# Patient Record
Sex: Male | Born: 1970 | Race: White | Hispanic: No | Marital: Married | Smoking: Never smoker
Health system: Southern US, Community
[De-identification: ages and names within clinical notes are randomized; demographics above are authoritative.]

## PROBLEM LIST (undated history)

## (undated) DIAGNOSIS — Z79899 Other long term (current) drug therapy: Secondary | ICD-10-CM

## (undated) DIAGNOSIS — F32A Depression, unspecified: Secondary | ICD-10-CM

## (undated) DIAGNOSIS — F329 Major depressive disorder, single episode, unspecified: Secondary | ICD-10-CM

## (undated) DIAGNOSIS — T7840XA Allergy, unspecified, initial encounter: Secondary | ICD-10-CM

## (undated) HISTORY — DX: Allergy, unspecified, initial encounter: T78.40XA

## (undated) HISTORY — PX: SHOULDER SURGERY: SHX246

## (undated) HISTORY — DX: Other long term (current) drug therapy: Z79.899

## (undated) HISTORY — DX: Major depressive disorder, single episode, unspecified: F32.9

## (undated) HISTORY — DX: Depression, unspecified: F32.A

---

## 2007-06-18 ENCOUNTER — Ambulatory Visit (HOSPITAL_COMMUNITY): Admission: RE | Admit: 2007-06-18 | Discharge: 2007-06-18 | Payer: Self-pay | Admitting: Family Medicine

## 2011-04-14 ENCOUNTER — Ambulatory Visit (INDEPENDENT_AMBULATORY_CARE_PROVIDER_SITE_OTHER): Payer: PRIVATE HEALTH INSURANCE

## 2011-04-14 DIAGNOSIS — M545 Low back pain: Secondary | ICD-10-CM

## 2011-04-14 DIAGNOSIS — M546 Pain in thoracic spine: Secondary | ICD-10-CM

## 2011-09-02 ENCOUNTER — Ambulatory Visit (INDEPENDENT_AMBULATORY_CARE_PROVIDER_SITE_OTHER): Payer: PRIVATE HEALTH INSURANCE | Admitting: Physician Assistant

## 2011-09-02 VITALS — BP 110/70 | HR 72 | Temp 98.1°F | Resp 17 | Ht 69.0 in | Wt 143.0 lb

## 2011-09-02 DIAGNOSIS — F411 Generalized anxiety disorder: Secondary | ICD-10-CM

## 2011-09-02 DIAGNOSIS — F419 Anxiety disorder, unspecified: Secondary | ICD-10-CM

## 2011-09-02 DIAGNOSIS — G4700 Insomnia, unspecified: Secondary | ICD-10-CM

## 2011-09-02 DIAGNOSIS — F341 Dysthymic disorder: Secondary | ICD-10-CM

## 2011-09-02 LAB — POCT CBC
Hemoglobin: 16.8 g/dL (ref 14.1–18.1)
MPV: 8.3 fL (ref 0–99.8)
POC Granulocyte: 2.6 (ref 2–6.9)
POC MID %: 7.1 %M (ref 0–12)
RBC: 5.62 M/uL (ref 4.69–6.13)

## 2011-09-02 MED ORDER — BUPROPION HCL ER (SR) 100 MG PO TB12: 100.0000 mg | ORAL_TABLET | Freq: Two times a day (BID) | ORAL | Status: AC

## 2011-09-02 MED ORDER — BUPROPION HCL 75 MG PO TABS: 75.0000 mg | ORAL_TABLET | Freq: Two times a day (BID) | ORAL | Status: DC

## 2011-09-02 MED ORDER — CLONAZEPAM 0.5 MG PO TABS: ORAL_TABLET | ORAL | Status: DC

## 2011-09-02 NOTE — Progress Notes (Signed)
  Subjective:    Patient ID: Erik Cain, male    DOB: [birth year], 41 y.o.   MRN: [id number]  HPI 41 yr old CM here to discuss medication for anxiety/depression.  His partner attempted suicide last week.  His partner has a h/o drug and alcohol abuse.  His partner has now been discharged from the hospital and is now on a 90 day meeting contract to go to AA.  Patient started counseling this morning with Ernest Pine.  She suggested he may have been suffering from depression for a while prior to his partner's suicide attempt.  He admits to anhedonia.  He admits to working too much, sleeping too much and either eating too much or too little for 7 or 8 months. He is quite preoccupied with his partner's issues and behaviors. But, he does practice self-care-works out with a trainer 3X/week and exercise class 2X/week.  Denies SI/HI Previously the only med he has ever taken that was anxiety/depression related was imipramine  Review of Systems  All other systems reviewed and are negative.       Objective:   Physical Exam  Constitutional: He is oriented to person, place, and time. He appears well-developed and well-nourished.  HENT:  Head: Normocephalic and atraumatic.  Neck: Normal range of motion. Neck supple. No thyromegaly present.  Cardiovascular: Normal rate, regular rhythm and normal heart sounds.   Pulmonary/Chest: Effort normal and breath sounds normal.  Neurological: He is alert and oriented to person, place, and time.  Skin: Skin is warm and dry.  Psychiatric: He has a normal mood and affect. His behavior is normal. Judgment and thought content normal.    Results for orders placed in visit on 09/02/11  TSH      Component Value Range   TSH 1.662  0.350 - 4.500 uIU/mL  VITAMIN D 25 HYDROXY      Component Value Range   Vit D, 25-Hydroxy 47  30 - 89 ng/mL  POCT CBC      Component Value Range   WBC 4.5 (*) 4.6 - 10.2 K/uL   Lymph, poc 1.6  0.6 - 3.4   POC LYMPH PERCENT 35.5  10 -  50 %L   MID (cbc) 0.3  0 - 0.9   POC MID % 7.1  0 - 12 %M   POC Granulocyte 2.6  2 - 6.9   Granulocyte percent 57.4  37 - 80 %G   RBC 5.62  4.69 - 6.13 M/uL   Hemoglobin 16.8  14.1 - 18.1 g/dL   HCT, POC 40.9  81.1 - 53.7 %   MCV 90.4  80 - 97 fL   MCH, POC 29.9  27 - 31.2 pg   MCHC 33.1  31.8 - 35.4 g/dL   RDW, POC 91.4     Platelet Count, POC 336  142 - 424 K/uL   MPV 8.3  0 - 99.8 fL       Assessment & Plan:  Reactive depression/anxiety-start wellbutrin.  Clonazepam  For breakthrough anxiety. Continue counseling.  [place] meeting schedule given and encouraged attendance. Continue exercise and other healthy coping behaviors.  Spent >30 mins face to face.

## 2011-09-03 ENCOUNTER — Encounter: Payer: Self-pay | Admitting: Family Medicine

## 2013-11-06 ENCOUNTER — Ambulatory Visit (INDEPENDENT_AMBULATORY_CARE_PROVIDER_SITE_OTHER): Payer: No Typology Code available for payment source | Admitting: Internal Medicine

## 2013-11-06 ENCOUNTER — Ambulatory Visit (INDEPENDENT_AMBULATORY_CARE_PROVIDER_SITE_OTHER): Payer: No Typology Code available for payment source

## 2013-11-06 VITALS — BP 114/72 | HR 83 | Temp 98.4°F | Resp 16 | Ht 69.0 in | Wt 153.0 lb

## 2013-11-06 DIAGNOSIS — S6990XA Unspecified injury of unspecified wrist, hand and finger(s), initial encounter: Secondary | ICD-10-CM

## 2013-11-06 DIAGNOSIS — S6992XA Unspecified injury of left wrist, hand and finger(s), initial encounter: Secondary | ICD-10-CM

## 2013-11-06 DIAGNOSIS — S6000XA Contusion of unspecified finger without damage to nail, initial encounter: Secondary | ICD-10-CM

## 2013-11-06 DIAGNOSIS — S6980XA Other specified injuries of unspecified wrist, hand and finger(s), initial encounter: Secondary | ICD-10-CM

## 2013-11-06 DIAGNOSIS — S60022A Contusion of left index finger without damage to nail, initial encounter: Secondary | ICD-10-CM

## 2013-11-06 NOTE — Patient Instructions (Signed)
Contusion °A contusion is a deep bruise. Contusions are the result of an injury that caused bleeding under the skin. The contusion may turn blue, purple, or yellow. Minor injuries will give you a painless contusion, but more severe contusions may stay painful and swollen for a few weeks.  °CAUSES  °A contusion is usually caused by a blow, trauma, or direct force to an area of the body. °SYMPTOMS  °· Swelling and redness of the injured area. °· Bruising of the injured area. °· Tenderness and soreness of the injured area. °· Pain. °DIAGNOSIS  °The diagnosis can be made by taking a history and physical exam. An X-ray, CT scan, or MRI may be needed to determine if there were any associated injuries, such as fractures. °TREATMENT  °Specific treatment will depend on what area of the body was injured. In general, the best treatment for a contusion is resting, icing, elevating, and applying cold compresses to the injured area. Over-the-counter medicines may also be recommended for pain control. Ask your caregiver what the best treatment is for your contusion. °HOME CARE INSTRUCTIONS  °· Put ice on the injured area. °¨ Put ice in a plastic bag. °¨ Place a towel between your skin and the bag. °¨ Leave the ice on for 15-20 minutes, 3-4 times a day, or as directed by your health care provider. °· Only take over-the-counter or prescription medicines for pain, discomfort, or fever as directed by your caregiver. Your caregiver may recommend avoiding anti-inflammatory medicines (aspirin, ibuprofen, and naproxen) for 48 hours because these medicines may increase bruising. °· Rest the injured area. °· If possible, elevate the injured area to reduce swelling. °SEEK IMMEDIATE MEDICAL CARE IF:  °· You have increased bruising or swelling. °· You have pain that is getting worse. °· Your swelling or pain is not relieved with medicines. °MAKE SURE YOU:  °· Understand these instructions. °· Will watch your condition. °· Will get help right  away if you are not doing well or get worse. °Document Released: 12/17/2004 Document Revised: 03/14/2013 Document Reviewed: 01/12/2011 °ExitCare® Patient Information ©2015 [employer]. This information is not intended to replace advice given to you by your health care provider. Make sure you discuss any questions you have with your health care provider. ° °

## 2013-11-06 NOTE — Progress Notes (Signed)
   Subjective:    Patient ID: Erik Cain, male    DOB: February 14, 1971, 43 y.o.   MRN: [id number]  HPI Patient presents today with left hand pain. He was moving a table about 2 weekends ago when his left hand was smashed between a table and a column. It turned black and blue and had some swelling. He used an OTC splint and the swelling and discoloration resolved, but he continues to have numbness of his left index finger. He felt like it was doing generally better until he tried to pick up his pen with that hand today and had shooting pain from base of finger to tip and residual pain. Has taken some ibuprofen/acetaminophen after accident with some relief.   Review of Systems No weakness, feels tight over knuckle, some decreased ROM.    Objective:   Physical Exam  Vitals reviewed. Constitutional: He is oriented to person, place, and time. He appears well-developed and well-nourished.  HENT:  Head: Normocephalic and atraumatic.  Neck: Normal range of motion. Neck supple.  Cardiovascular: Normal rate.   Pulmonary/Chest: Effort normal.  Musculoskeletal:  Left index finger with slight swelling of area around PIP. No erythema, no discoloration. No weakness, slight decreased ROM with flexion.  Neurological: He is alert and oriented to person, place, and time.  Skin: Skin is warm and dry.  Psychiatric: He has a normal mood and affect. His behavior is normal. Judgment and thought content normal.   Left index finger xray- [facility] reading (PRIMARY) by  Dr. Merla Richesoolittle- no fracture       Assessment & Plan:  1. Injury of index finger, left, initial encounter - DG Finger Index Left; Future  2. Contusion of left index finger without damage to nail, initial encounter - Can use NSAIDs or acetaminophen as needed for pain -heat prn -gentle ROM several times a day   Emi Belfasteborah B. Gilberte Gorley, FNP-BC  [facility], [facility]  11/06/2013 6:22 PM I have completed the  patient encounter in its entirety as documented by FNP Leone PayorGessner, with editing by me where necessary. Robert P. Merla Richesoolittle, M.D.

## 2014-03-05 ENCOUNTER — Ambulatory Visit: Payer: No Typology Code available for payment source

## 2014-10-15 ENCOUNTER — Encounter: Payer: Self-pay | Admitting: Family Medicine

## 2014-10-15 ENCOUNTER — Ambulatory Visit (INDEPENDENT_AMBULATORY_CARE_PROVIDER_SITE_OTHER): Payer: No Typology Code available for payment source | Admitting: Family Medicine

## 2014-10-15 VITALS — BP 110/68 | HR 70 | Ht 68.5 in | Wt 156.0 lb

## 2014-10-15 DIAGNOSIS — Z114 Encounter for screening for human immunodeficiency virus [HIV]: Secondary | ICD-10-CM | POA: Diagnosis not present

## 2014-10-15 NOTE — Progress Notes (Signed)
   Subjective:    Patient ID: Erik Cain, male    DOB: 04/12/1970, 44 y.o.   MRN: [id number]  HPI He is here for a get acquainted visit. He has not had a physician in quite some time. He has no particular concerns or complaints. He is involved in a research protocol. The protocol is essentially giving an antibody and they will be measuring this. He does need to be given Truvada on a regular basis. He will get this free. They will be checking him every month for HIV and RPR. He has had a recent test which was negative. Review of the record also indicates he needs an immunization update. He is involved in a relationship last 4 months. Theparently monogamous.  Review of Systems     Objective:   Physical Exam Alert and in no distress otherwise not examined       Assessment & Plan:  Encounter for screening for HIV - Plan: emtricitabine-tenofovir (TRUVADA) 200-300 MG per tablet  he will be tested HIV and RPR on a regular basis. If he is indeed positive the research protocol would then send me the information. They will be doing routine blood screening on him. Discussed health maintenance with him. Since he will be getting close follow-up for next 2 years, I do not think that a complete exam a blood work at this time is necessary. He is comfortable with this.

## 2014-10-16 ENCOUNTER — Telehealth: Payer: Self-pay | Admitting: Family Medicine

## 2014-10-16 MED ORDER — EMTRICITABINE-TENOFOVIR DF 200-300 MG PO TABS: 1.0000 | ORAL_TABLET | Freq: Every day | ORAL | Status: DC

## 2014-10-16 NOTE — Telephone Encounter (Signed)
Pt called stating that he does not need the Truvada that was sent to [facility] because he was given a hand written script for the same med so he can send it in to his mail order pharmacy where he can get the med at no cost or at least less expensive. I called [facility] and cancelled med today.

## 2014-12-26 ENCOUNTER — Encounter: Payer: Self-pay | Admitting: Family Medicine

## 2014-12-28 ENCOUNTER — Telehealth: Payer: Self-pay | Admitting: Family Medicine

## 2014-12-28 NOTE — Telephone Encounter (Signed)
Work with him on this. I don't care where he gets it

## 2014-12-28 NOTE — Telephone Encounter (Signed)
Please be on the lookout for Rx request then.

## 2014-12-28 NOTE — Telephone Encounter (Signed)
Pt called and was wanting you to cancel there Rx for truvada said he went to get it last night and it was 1000$ and now he is going to get it through drugstore mail order pharmacy for free, pt is going through a research study program through [facility] for HIV, testing, the mail order service is covering the cost of the RX, pt can pt uses the [facility] at [address] Pt said if you had any questions please contact them at [phone], the number for the mail order service is [phone], he said they where suppose to be getting in contact with the office about RX

## 2014-12-31 ENCOUNTER — Other Ambulatory Visit: Payer: Self-pay

## 2014-12-31 DIAGNOSIS — Z114 Encounter for screening for human immunodeficiency virus [HIV]: Secondary | ICD-10-CM

## 2014-12-31 MED ORDER — EMTRICITABINE-TENOFOVIR DF 200-300 MG PO TABS: 1.0000 | ORAL_TABLET | Freq: Every day | ORAL | Status: DC

## 2014-12-31 NOTE — Telephone Encounter (Signed)
Med sent in.

## 2014-12-31 NOTE — Telephone Encounter (Signed)
I have called [facility] and canceled Rx will call other pharmacy next

## 2015-06-23 ENCOUNTER — Ambulatory Visit (INDEPENDENT_AMBULATORY_CARE_PROVIDER_SITE_OTHER): Payer: Self-pay | Admitting: Osteopathic Medicine

## 2015-06-23 ENCOUNTER — Ambulatory Visit (INDEPENDENT_AMBULATORY_CARE_PROVIDER_SITE_OTHER): Payer: Self-pay

## 2015-06-23 VITALS — BP 122/72 | HR 90 | Temp 98.6°F | Resp 17 | Ht 69.0 in | Wt 155.0 lb

## 2015-06-23 DIAGNOSIS — M25571 Pain in right ankle and joints of right foot: Secondary | ICD-10-CM

## 2015-06-23 DIAGNOSIS — S93401A Sprain of unspecified ligament of right ankle, initial encounter: Secondary | ICD-10-CM | POA: Insufficient documentation

## 2015-06-23 DIAGNOSIS — Z79899 Other long term (current) drug therapy: Secondary | ICD-10-CM

## 2015-06-23 HISTORY — DX: Other long term (current) drug therapy: Z79.899

## 2015-06-23 MED ORDER — IBUPROFEN 600 MG PO TABS: 600.0000 mg | ORAL_TABLET | Freq: Three times a day (TID) | ORAL | Status: AC | PRN

## 2015-06-23 NOTE — Patient Instructions (Addendum)
X-ray did not show a broken ankle, likely this is a severe sprain. Follow instructions as noted below for rehabilitation. Follow up with your primary care physician in one to 2 weeks, sooner if this gets worse. Ibuprofen or Tylenol as needed for pain, caution with Ibuprofen as high doses of this can increase risk of kidney problems with the Truvada.   Acute Ankle Sprain With Phase I Rehab An acute ankle sprain is a partial or complete tear in one or more of the ligaments of the ankle due to traumatic injury. The severity of the injury depends on both the number of ligaments sprained and the grade of sprain. There are 3 grades of sprains.   A grade 1 sprain is a mild sprain. There is a slight pull without obvious tearing. There is no loss of strength, and the muscle and ligament are the correct length.  A grade 2 sprain is a moderate sprain. There is tearing of fibers within the substance of the ligament where it connects two bones or two cartilages. The length of the ligament is increased, and there is usually decreased strength.  A grade 3 sprain is a complete rupture of the ligament and is uncommon. In addition to the grade of sprain, there are three types of ankle sprains.  Lateral ankle sprains: This is a sprain of one or more of the three ligaments on the outer side (lateral) of the ankle. These are the most common sprains. Medial ankle sprains: There is one large triangular ligament of the inner side (medial) of the ankle that is susceptible to injury. Medial ankle sprains are less common. Syndesmosis, "high ankle," sprains: The syndesmosis is the ligament that connects the two bones of the lower leg. Syndesmosis sprains usually only occur with very severe ankle sprains. SYMPTOMS  Pain, tenderness, and swelling in the ankle, starting at the side of injury that may progress to the whole ankle and foot with time.  "Pop" or tearing sensation at the time of injury.  Bruising that may spread to  the heel.  Impaired ability to walk soon after injury. CAUSES   Acute ankle sprains are caused by trauma placed on the ankle that temporarily forces or pries the anklebone (talus) out of its normal socket.  Stretching or tearing of the ligaments that normally hold the joint in place (usually due to a twisting injury). RISK INCREASES WITH:  Previous ankle sprain.  Sports in which the foot may land awkwardly (i.e., basketball, volleyball, or soccer) or walking or running on uneven or rough surfaces.  Shoes with inadequate support to prevent sideways motion when stress occurs.  Poor strength and flexibility.  Poor balance skills.  Contact sports. PREVENTION   Warm up and stretch properly before activity.  Maintain physical fitness:  Ankle and leg flexibility, muscle strength, and endurance.  Cardiovascular fitness.  Balance training activities.  Use proper technique and have a coach correct improper technique.  Taping, protective strapping, bracing, or high-top tennis shoes may help prevent injury. Initially, tape is best; however, it loses most of its support function within 10 to 15 minutes.  Wear proper-fitted protective shoes (High-top shoes with taping or bracing is more effective than either alone).  Provide the ankle with support during sports and practice activities for 12 months following injury. PROGNOSIS   If treated properly, ankle sprains can be expected to recover completely; however, the length of recovery depends on the degree of injury.  A grade 1 sprain usually heals enough in 5  to 7 days to allow modified activity and requires an average of 6 weeks to heal completely.  A grade 2 sprain requires 6 to 10 weeks to heal completely.  A grade 3 sprain requires 12 to 16 weeks to heal.  A syndesmosis sprain often takes more than 3 months to heal. RELATED COMPLICATIONS   Frequent recurrence of symptoms may result in a chronic problem. Appropriately  addressing the problem the first time decreases the frequency of recurrence and optimizes healing time. Severity of the initial sprain does not predict the likelihood of later instability.  Injury to other structures (bone, cartilage, or tendon).  A chronically unstable or arthritic ankle joint is a possibility with repeated sprains. TREATMENT Treatment initially involves the use of ice, medication, and compression bandages to help reduce pain and inflammation. Ankle sprains are usually immobilized in a walking cast or boot to allow for healing. Crutches may be recommended to reduce pressure on the injury. After immobilization, strengthening and stretching exercises may be necessary to regain strength and a full range of motion. Surgery is rarely needed to treat ankle sprains. MEDICATION   Nonsteroidal anti-inflammatory medications, such as aspirin and ibuprofen (do not take for the first 3 days after injury or within 7 days before surgery), or other minor pain relievers, such as acetaminophen, are often recommended. Take these as directed by your caregiver. Contact your caregiver immediately if any bleeding, stomach upset, or signs of an allergic reaction occur from these medications.  Ointments applied to the skin may be helpful.  Pain relievers may be prescribed as necessary by your caregiver. Do not take prescription pain medication for longer than 4 to 7 days. Use only as directed and only as much as you need. HEAT AND COLD  Cold treatment (icing) is used to relieve pain and reduce inflammation for acute and chronic cases. Cold should be applied for 10 to 15 minutes every 2 to 3 hours for inflammation and pain and immediately after any activity that aggravates your symptoms. Use ice packs or an ice massage.  Heat treatment may be used before performing stretching and strengthening activities prescribed by your caregiver. Use a heat pack or a warm soak. SEEK IMMEDIATE MEDICAL CARE IF:    Pain, swelling, or bruising worsens despite treatment.  You experience pain, numbness, discoloration, or coldness in the foot or toes.  New, unexplained symptoms develop (drugs used in treatment may produce side effects.) EXERCISES  PHASE I EXERCISES RANGE OF MOTION (ROM) AND STRETCHING EXERCISES - Ankle Sprain, Acute Phase I, Weeks 1 to 2 These exercises may help you when beginning to restore flexibility in your ankle. You will likely work on these exercises for the 1 to 2 weeks after your injury. Once your physician, physical therapist, or athletic trainer sees adequate progress, he or she will advance your exercises. While completing these exercises, remember:   Restoring tissue flexibility helps normal motion to return to the joints. This allows healthier, less painful movement and activity.  An effective stretch should be held for at least 30 seconds.  A stretch should never be painful. You should only feel a gentle lengthening or release in the stretched tissue. RANGE OF MOTION - Dorsi/Plantar Flexion  While sitting with your right / left knee straight, draw the top of your foot upwards by flexing your ankle. Then reverse the motion, pointing your toes downward.  Hold each position for __________ seconds.  After completing your first set of exercises, repeat this exercise with your knee  bent. Repeat __________ times. Complete this exercise __________ times per day.  RANGE OF MOTION - Ankle Alphabet  Imagine your right / left big toe is a pen.  Keeping your hip and knee still, write out the entire alphabet with your "pen." Make the letters as large as you can without increasing any discomfort. Repeat __________ times. Complete this exercise __________ times per day.  STRENGTHENING EXERCISES - Ankle Sprain, Acute -Phase I, Weeks 1 to 2 These exercises may help you when beginning to restore strength in your ankle. You will likely work on these exercises for 1 to 2 weeks after  your injury. Once your physician, physical therapist, or athletic trainer sees adequate progress, he or she will advance your exercises. While completing these exercises, remember:   Muscles can gain both the endurance and the strength needed for everyday activities through controlled exercises.  Complete these exercises as instructed by your physician, physical therapist, or athletic trainer. Progress the resistance and repetitions only as guided.  You may experience muscle soreness or fatigue, but the pain or discomfort you are trying to eliminate should never worsen during these exercises. If this pain does worsen, stop and make certain you are following the directions exactly. If the pain is still present after adjustments, discontinue the exercise until you can discuss the trouble with your clinician. STRENGTH - Dorsiflexors  Secure a rubber exercise band/tubing to a fixed object (i.e., table, pole) and loop the other end around your right / left foot.  Sit on the floor facing the fixed object. The band/tubing should be slightly tense when your foot is relaxed.  Slowly draw your foot back toward you using your ankle and toes.  Hold this position for __________ seconds. Slowly release the tension in the band and return your foot to the starting position. Repeat __________ times. Complete this exercise __________ times per day.  STRENGTH - Plantar-flexors   Sit with your right / left leg extended. Holding onto both ends of a rubber exercise band/tubing, loop it around the ball of your foot. Keep a slight tension in the band.  Slowly push your toes away from you, pointing them downward.  Hold this position for __________ seconds. Return slowly, controlling the tension in the band/tubing. Repeat __________ times. Complete this exercise __________ times per day.  STRENGTH - Ankle Eversion  Secure one end of a rubber exercise band/tubing to a fixed object (table, pole). Loop the other end  around your foot just before your toes.  Place your fists between your knees. This will focus your strengthening at your ankle.  Drawing the band/tubing across your opposite foot, slowly, pull your little toe out and up. Make sure the band/tubing is positioned to resist the entire motion.  Hold this position for __________ seconds. Have your muscles resist the band/tubing as it slowly pulls your foot back to the starting position.  Repeat __________ times. Complete this exercise __________ times per day.  STRENGTH - Ankle Inversion  Secure one end of a rubber exercise band/tubing to a fixed object (table, pole). Loop the other end around your foot just before your toes.  Place your fists between your knees. This will focus your strengthening at your ankle.  Slowly, pull your big toe up and in, making sure the band/tubing is positioned to resist the entire motion.  Hold this position for __________ seconds.  Have your muscles resist the band/tubing as it slowly pulls your foot back to the starting position. Repeat __________ times. Complete  this exercises __________ times per day.  STRENGTH - Towel Curls  Sit in a chair positioned on a non-carpeted surface.  Place your right / left foot on a towel, keeping your heel on the floor.  Pull the towel toward your heel by only curling your toes. Keep your heel on the floor.  If instructed by your physician, physical therapist, or athletic trainer, add weight to the end of the towel. Repeat __________ times. Complete this exercise __________ times per day.   This information is not intended to replace advice given to you by your health care provider. Make sure you discuss any questions you have with your health care provider.   Document Released: 10/08/2004 Document Revised: 03/30/2014 Document Reviewed: 06/21/2008 Elsevier Interactive Patient Education 2016 [employer].    IF you received an x-ray today, you will receive an invoice  from [facility]. Please contact [facility] at [phone] with questions or concerns regarding your invoice.   IF you received labwork today, you will receive an invoice from [employer]. Please contact [facility] at [phone] with questions or concerns regarding your invoice.   Our billing staff will not be able to assist you with questions regarding bills from these companies.  You will be contacted with the lab results as soon as they are available. The fastest way to get your results is to activate your My Chart account. Instructions are located on the last page of this paperwork. If you have not heard from [place] regarding the results in 2 weeks, please contact this office.

## 2015-06-23 NOTE — Progress Notes (Deleted)
   Subjective:    Patient ID: Erik RainbowWilliam G Chichester, male    DOB: Apr 08, 1970, 45 y.o.   MRN: [id number]  HPI    Review of Systems     Objective:   Physical Exam        Assessment & Plan:

## 2015-06-23 NOTE — Progress Notes (Signed)
HPI: Erik Cain is a 45 y.o. male who presents to [facility] today for chief complaint of:  Chief Complaint  Patient presents with  . Ankle Pain    right side     ANKLE INJURY  . Location: R aknle . Quality: sore, throbbing . Severity: severe . Duration: injury happened this morning 06/23/2015  . Context: fall coming down steps . Assoc signs/symptoms: no numbness, able to ambulate but is painful   Past medical, social and family history reviewed: Past Medical History  Diagnosis Date  . Allergy   . Depression    Past Surgical History  Procedure Laterality Date  . Shoulder surgery     Social History  Substance Use Topics  . Smoking status: Never Smoker   . Smokeless tobacco: Not on file  . Alcohol Use: Not on file   Family History  Problem Relation Age of Onset  . Diabetes Mother   . Heart disease Mother   . Hyperlipidemia Mother   . Hypertension Mother   . Diabetes Father   . Heart disease Father   . Hyperlipidemia Father   . Hypertension Father     Current Outpatient Prescriptions  Medication Sig Dispense Refill  . emtricitabine-tenofovir (TRUVADA) 200-300 MG tablet Take 1 tablet by mouth daily. 90 tablet 3   No current facility-administered medications for this visit.   No Known Allergies    Review of Systems: CONSTITUTIONAL:  No  fever, no chills, MUSCULOSKELETAL: (+) myalgia/arthralgiaas per HPI SKIN: No  rash/wounds/concerning lesions HEM/ONC: No  easy bruising/bleeding, No  abnormal lymph node NEUROLOGIC: No  weakness, No  dizziness  Exam:  BP 122/72 mmHg  Pulse 90  Temp(Src) 98.6 F (37 C) (Oral)  Resp 17  Ht 5\' 9"  (1.753 m)  Wt 155 lb (70.308 kg)  BMI 22.88 kg/m2  SpO2 97% Constitutional: VS see above. General Appearance: alert, well-developed, well-nourished, NAD Eyes: Normal lids and conjunctive, non-icteric sclera,  Neck: No masses, trachea midline. Respiratory: Normal respiratory effort.   Cardiovascular: No lower extremity edema. II/IV pulses PT and DP.  Musculoskeletal: Gait antalgic favoring R. No clubbing/cyanosis of digits. Ankle drawer test unable to assess due to pain. (+) tenderness and edema lateral malleolus, no pain on Squeeze Test Neurological: No cranial nerve deficit on limited exam. Motor and sensation intact and symmetric, sensation intact all R toes Skin: warm, dry, intact. (+) ecchymoses.swelling R ankle lateral. No rash/ulcer. No concerning nevi or subq nodules on limited exam.    No results found for this or any previous visit (from the past 72 hour(s)).  X-ray ankle personally reviewed, no obvious fracture, radiology over read as below.  Dg Ankle Complete Right  06/23/2015  CLINICAL DATA:  Acute right ankle pain after fall last night. Initial encounter. EXAM: RIGHT ANKLE - COMPLETE 3+ VIEW COMPARISON:  None. FINDINGS: There is no evidence of fracture, dislocation, or joint effusion. There is no evidence of arthropathy or other focal bone abnormality. Soft tissue swelling is seen over the lateral malleolus. IMPRESSION: No fracture or dislocation is noted. Soft tissue swelling is seen over the lateral malleolus suggesting ligamentous injury. Electronically Signed   By: Lupita RaiderJames  Green Jr, M.D.   On: 06/23/2015 09:19     ASSESSMENT/PLAN:   Information and patient instructions were printed, reviewed with the patient.   Patient states that he had another commitment, so he left the clinic prior to application of splint, I advised patient on splint he could get at  medical supply store on his own, he agrees. At least would need air splint.   Advise follow-up with PCP in 1-2 weeks, with PCP or return to this clinic sooner if worse or if any other concerns.   Consider severe ATF sprain, possible grade 2 or 3, physical exam is not concerning at this point for high ankle sprain but patient may need MRI to further evaluate.   Right ankle sprain, initial encounter - Plan:  ibuprofen (ADVIL,MOTRIN) 600 MG tablet  Right ankle pain - Plan: DG Ankle Complete Right  Medication management  Return in about 1 week (around 06/30/2015), or sooner if needed, for recheck with PCP.

## 2016-12-02 ENCOUNTER — Ambulatory Visit: Payer: Self-pay | Admitting: Infectious Disease

## 2017-01-25 ENCOUNTER — Ambulatory Visit (INDEPENDENT_AMBULATORY_CARE_PROVIDER_SITE_OTHER): Payer: Self-pay | Admitting: Infectious Disease

## 2017-01-25 ENCOUNTER — Encounter (INDEPENDENT_AMBULATORY_CARE_PROVIDER_SITE_OTHER): Payer: Self-pay | Admitting: *Deleted

## 2017-01-25 VITALS — BP 141/97 | HR 93 | Temp 98.2°F | Ht 69.0 in | Wt 163.5 lb

## 2017-01-25 DIAGNOSIS — Z006 Encounter for examination for normal comparison and control in clinical research program: Secondary | ICD-10-CM

## 2017-01-25 NOTE — Progress Notes (Signed)
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 Ppt is okay with text reminders and snail mail; confirmed contact information. Ppt is also interested in a social event and is willing to donate space/resources to assist. Ppt's mychart is active.

## 2017-01-25 NOTE — Progress Notes (Signed)
Erik Cain is here today to screen for the HPTN 083 study. He was [facility] and has a good understanding of what this study involves. He had his last visit for AMP on 11/27/16 and permission from the team was given for his participation in this study. He denies any current medical problems or any medications except for OTC meds such as tylenol and sinus/cold med. He does have a tattoo but it is in the middle of his lower back and will not interfere with Injections. An EKG was done which showed NSR. We are planning entry on 11/19.

## 2017-01-25 NOTE — Progress Notes (Signed)
Subjective:   Chief complaint: Mr Erik Cain is here for CPE for HPTN 083   Patient ID: Erik RainbowWilliam G Cain, male    DOB: 1970-12-06, 46 y.o.   MRN: [id number]  HPI  Mr. Erik Cain is here for complete physical exam prior to screening and potential entry into HPTN 083 PrEP trial.   [facility] which is now [facility].  Prior to that he had been on Truvada but his insurance had stopped covering it because of him "engaging in high risk behavior.""  His HIV - partner is also interested in PrEP and has NO INSURANCE. So I gave Erik Cain flier for our PrEP clinic with [facility] on Friday.    Past Medical History:  Diagnosis Date  . Allergy   . Depression   . Medication management 06/23/2015   On Truvada for HIV ppx, no active infection known    Past Surgical History:  Procedure Laterality Date  . SHOULDER SURGERY      Family History  Problem Relation Age of Onset  . Diabetes Mother   . Heart disease Mother   . Hyperlipidemia Mother   . Hypertension Mother   . Diabetes Father   . Heart disease Father   . Hyperlipidemia Father   . Hypertension Father       Social History   Socioeconomic History  . Marital status: Single    Spouse name: Not on file  . Number of children: Not on file  . Years of education: Not on file  . Highest education level: Not on file  Social Needs  . Financial resource strain: Not on file  . Food insecurity - worry: Not on file  . Food insecurity - inability: Not on file  . Transportation needs - medical: Not on file  . Transportation needs - non-medical: Not on file  Occupational History  . Not on file  Tobacco Use  . Smoking status: Never Smoker  Substance and Sexual Activity  . Alcohol use: Not on file  . Drug use: Not on file  . Sexual activity: Not on file  Other Topics Concern  . Not on file  Social History Narrative  . Not on file    No Known Allergies   Current Outpatient Medications:  .  emtricitabine-tenofovir  (TRUVADA) 200-300 MG tablet, Take 1 tablet by mouth daily., Disp: 90 tablet, Rfl: 3 .  ibuprofen (ADVIL,MOTRIN) 600 MG tablet, Take 1 tablet (600 mg total) by mouth every 8 (eight) hours as needed., Disp: 30 tablet, Rfl: 1    Review of Systems  Constitutional: Negative for activity change, appetite change, chills, diaphoresis, fatigue, fever and unexpected weight change.  HENT: Negative for congestion, rhinorrhea, sinus pressure, sneezing, sore throat and trouble swallowing.   Eyes: Negative for photophobia and visual disturbance.  Respiratory: Negative for cough, chest tightness, shortness of breath, wheezing and stridor.   Cardiovascular: Negative for chest pain, palpitations and leg swelling.  Gastrointestinal: Negative for abdominal distention, abdominal pain, anal bleeding, blood in stool, constipation, diarrhea, nausea and vomiting.  Genitourinary: Negative for difficulty urinating, dysuria, flank pain and hematuria.  Musculoskeletal: Negative for arthralgias, back pain, gait problem, joint swelling and myalgias.  Skin: Negative for color change, pallor, rash and wound.  Neurological: Negative for dizziness, tremors, weakness and light-headedness.  Hematological: Negative for adenopathy. Does not bruise/bleed easily.  Psychiatric/Behavioral: Negative for agitation, behavioral problems, confusion, decreased concentration, dysphoric mood and sleep disturbance.       Objective:  Physical Exam  Constitutional: He is oriented to person, place, and time. He appears well-developed and well-nourished. No distress.  HENT:  Head: Normocephalic and atraumatic.  Right Ear: External ear normal.  Left Ear: External ear normal.  Nose: Nose normal.  Mouth/Throat: Oropharynx is clear and moist. No oropharyngeal exudate.  Eyes: Conjunctivae and EOM are normal. Pupils are equal, round, and reactive to light. Right eye exhibits no discharge. Left eye exhibits no discharge. No scleral icterus.    Neck: Normal range of motion. Neck supple. No JVD present.  Cardiovascular: Normal rate, regular rhythm and normal heart sounds. Exam reveals no gallop and no friction rub.  No murmur heard. Pulmonary/Chest: Effort normal and breath sounds normal. No respiratory distress. He has no wheezes. He has no rales.  Abdominal: Soft. Bowel sounds are normal. He exhibits no distension and no mass. There is no hepatosplenomegaly. There is no tenderness. There is no rebound, no guarding and no CVA tenderness.  Musculoskeletal: He exhibits no edema or tenderness.  Lymphadenopathy:       Head (right side): No submental, no submandibular, no tonsillar, no preauricular, no posterior auricular and no occipital adenopathy present.       Head (left side): No submental, no submandibular, no tonsillar, no preauricular, no posterior auricular and no occipital adenopathy present.       Right cervical: No superficial cervical and no deep cervical adenopathy present.      Left cervical: No superficial cervical, no deep cervical and no posterior cervical adenopathy present.       Right: No supraclavicular adenopathy present.       Left: No supraclavicular adenopathy present.  Neurological: He is alert and oriented to person, place, and time. He has normal strength. He displays no atrophy and no tremor. No cranial nerve deficit or sensory deficit. He exhibits normal muscle tone. He displays no seizure activity. Coordination and gait normal.  Skin: Skin is warm and dry. No rash noted. He is not diaphoretic. No erythema. No pallor.  Psychiatric: He has a normal mood and affect. His behavior is normal. Judgment and thought content normal.          Assessment & Plan:   Normal CPE He is to have EKG today as well.

## 2017-01-26 ENCOUNTER — Telehealth: Payer: Self-pay | Admitting: Pharmacist Clinician (PhC)/ Clinical Pharmacy Specialist

## 2017-01-26 NOTE — Telephone Encounter (Signed)
Talked to Erik Cain to schedule his partner here for uninsured PrEP and what to bring in to the first visit for PAP. He will push his partner to do it.

## 2017-01-26 NOTE — Telephone Encounter (Signed)
Excellent thanks Minh!

## 2017-01-27 ENCOUNTER — Other Ambulatory Visit: Payer: Self-pay | Admitting: *Deleted

## 2017-01-27 LAB — CBC
HEMATOCRIT: 47.2 % (ref 38.5–50.0)
Hemoglobin: 16.5 g/dL (ref 13.2–17.1)
MCH: 30.1 pg (ref 27.0–33.0)
MCHC: 35 g/dL (ref 32.0–36.0)
MCV: 86 fL (ref 80.0–100.0)
MPV: 9.6 fL (ref 7.5–12.5)
PLATELETS: 340 10*3/uL (ref 140–400)
RBC: 5.49 10*6/uL (ref 4.20–5.80)
RDW: 12.2 % (ref 11.0–15.0)
WBC: 4.2 10*3/uL (ref 3.8–10.8)

## 2017-01-27 LAB — HEPATITIS B SURFACE ANTIGEN: HEP B S AG: NONREACTIVE

## 2017-01-27 LAB — COMPREHENSIVE METABOLIC PANEL
AG RATIO: 1.4 (calc) (ref 1.0–2.5)
ALT: 18 U/L (ref 9–46)
AST: 21 U/L (ref 10–40)
Albumin: 4.5 g/dL (ref 3.6–5.1)
Alkaline phosphatase (APISO): 71 U/L (ref 40–115)
BUN: 17 mg/dL (ref 7–25)
CO2: 27 mmol/L (ref 20–32)
CREATININE: 1.23 mg/dL (ref 0.60–1.35)
Calcium: 9.4 mg/dL (ref 8.6–10.3)
Chloride: 100 mmol/L (ref 98–110)
GLUCOSE: 81 mg/dL (ref 65–99)
Globulin: 3.2 g/dL (calc) (ref 1.9–3.7)
Potassium: 4.1 mmol/L (ref 3.5–5.3)
SODIUM: 137 mmol/L (ref 135–146)
TOTAL PROTEIN: 7.7 g/dL (ref 6.1–8.1)
Total Bilirubin: 0.7 mg/dL (ref 0.2–1.2)

## 2017-01-27 LAB — DIFFERENTIAL
BASOS PCT: 0.7 %
Basophils Absolute: 29 cells/uL (ref 0–200)
EOS PCT: 3.9 %
Eosinophils Absolute: 164 cells/uL (ref 15–500)
LYMPHS ABS: 1315 {cells}/uL (ref 850–3900)
Monocytes Relative: 6.7 %
NEUTROS ABS: 2411 {cells}/uL (ref 1500–7800)
NEUTROS PCT: 57.4 %
Total Lymphocyte: 31.3 %
WBC mixed population: 281 cells/uL (ref 200–950)

## 2017-01-27 LAB — TEST AUTHORIZATION

## 2017-01-27 LAB — HIV ANTIBODY (ROUTINE TESTING W REFLEX): HIV: NONREACTIVE

## 2017-01-27 LAB — HEPATITIS C ANTIBODY
HEP C AB: NONREACTIVE
SIGNAL TO CUT-OFF: 0.01 (ref <1.00)

## 2017-01-28 LAB — HIV-1 RNA, QUALITATIVE, TMA: HIV-1 RNA, Qualitative, TMA: NOT DETECTED

## 2017-02-08 ENCOUNTER — Encounter (INDEPENDENT_AMBULATORY_CARE_PROVIDER_SITE_OTHER): Payer: Self-pay | Admitting: *Deleted

## 2017-02-08 VITALS — BP 135/98 | HR 91 | Temp 98.1°F | Wt 165.0 lb

## 2017-02-08 DIAGNOSIS — Z006 Encounter for examination for normal comparison and control in clinical research program: Secondary | ICD-10-CM

## 2017-02-08 NOTE — Progress Notes (Signed)
Study: A Phase 2b/3 Double Blind Safety and Efficacy Study of Injectable Cabotegravir compared to Daily Oral Tenofovir Disoproxil Fumarate/Emtricitabine (TDF/FTC), For Pre-Exposure Prophylaxis in HIV-Uninfected Cisgender Men and Transgender Women who have sex with Men.  Medication: Investigational Injectable Cabotegravir/placebo compared to Truvada/placebo. Duration: Around 4 years.  Erik Cain is here for Entry visit. After confirming willingness to enroll onto study I drew his blood. Assessment unchanged since last study visit. HIV rapid confirmed non-reactive. Questionnaires completed. Study meds dispensed. We discussed proper administration, potential side effects, and gave him my info should he have any questions or concerns. He verbalized understanding. He received $50 gift card for visit. Next appointment scheduled for 12/3

## 2017-02-09 LAB — LIPASE: Lipase: 56 U/L (ref 7–60)

## 2017-02-09 LAB — LIPID PANEL
CHOL/HDL RATIO: 3.9 (calc) (ref <5.0)
CHOLESTEROL: 200 mg/dL — AB (ref <200)
HDL: 51 mg/dL (ref >40)
LDL CHOLESTEROL (CALC): 128 mg/dL — AB
Non-HDL Cholesterol (Calc): 149 mg/dL (calc) — ABNORMAL HIGH (ref <130)
Triglycerides: 104 mg/dL (ref <150)

## 2017-02-09 LAB — CBC WITH DIFFERENTIAL/PLATELET
BASOS PCT: 0.9 %
Basophils Absolute: 40 cells/uL (ref 0–200)
EOS PCT: 3.2 %
Eosinophils Absolute: 141 cells/uL (ref 15–500)
HCT: 48 % (ref 38.5–50.0)
HEMOGLOBIN: 16.7 g/dL (ref 13.2–17.1)
Lymphs Abs: 1382 cells/uL (ref 850–3900)
MCH: 29.7 pg (ref 27.0–33.0)
MCHC: 34.8 g/dL (ref 32.0–36.0)
MCV: 85.3 fL (ref 80.0–100.0)
MONOS PCT: 6.4 %
MPV: 9.9 fL (ref 7.5–12.5)
NEUTROS ABS: 2556 {cells}/uL (ref 1500–7800)
Neutrophils Relative %: 58.1 %
Platelets: 305 10*3/uL (ref 140–400)
RBC: 5.63 10*6/uL (ref 4.20–5.80)
RDW: 12.2 % (ref 11.0–15.0)
Total Lymphocyte: 31.4 %
WBC mixed population: 282 cells/uL (ref 200–950)
WBC: 4.4 10*3/uL (ref 3.8–10.8)

## 2017-02-09 LAB — COMPREHENSIVE METABOLIC PANEL
AG Ratio: 1.5 (calc) (ref 1.0–2.5)
ALBUMIN MSPROF: 4.4 g/dL (ref 3.6–5.1)
ALT: 22 U/L (ref 9–46)
AST: 21 U/L (ref 10–40)
Alkaline phosphatase (APISO): 62 U/L (ref 40–115)
BUN: 13 mg/dL (ref 7–25)
CHLORIDE: 101 mmol/L (ref 98–110)
CO2: 28 mmol/L (ref 20–32)
CREATININE: 1.2 mg/dL (ref 0.60–1.35)
Calcium: 9.1 mg/dL (ref 8.6–10.3)
GLOBULIN: 3 g/dL (ref 1.9–3.7)
GLUCOSE: 107 mg/dL — AB (ref 65–99)
POTASSIUM: 4.1 mmol/L (ref 3.5–5.3)
Sodium: 139 mmol/L (ref 135–146)
Total Bilirubin: 1 mg/dL (ref 0.2–1.2)
Total Protein: 7.4 g/dL (ref 6.1–8.1)

## 2017-02-09 LAB — HEPATITIS B CORE ANTIBODY, TOTAL: Hep B Core Total Ab: NONREACTIVE

## 2017-02-09 LAB — AMYLASE: AMYLASE: 50 U/L (ref 21–101)

## 2017-02-09 LAB — HIV ANTIBODY (ROUTINE TESTING W REFLEX): HIV: NONREACTIVE

## 2017-02-09 LAB — C. TRACHOMATIS/N. GONORRHOEAE RNA
C. trachomatis RNA, TMA: NOT DETECTED
N. gonorrhoeae RNA, TMA: NOT DETECTED

## 2017-02-09 LAB — CK: CK TOTAL: 92 U/L (ref 44–196)

## 2017-02-09 LAB — URINALYSIS
BILIRUBIN URINE: NEGATIVE
GLUCOSE, UA: NEGATIVE
HGB URINE DIPSTICK: NEGATIVE
KETONES UR: NEGATIVE
Leukocytes, UA: NEGATIVE
Nitrite: NEGATIVE
PH: 5.5 (ref 5.0–8.0)
Protein, ur: NEGATIVE
Specific Gravity, Urine: 1.017 (ref 1.001–1.03)

## 2017-02-09 LAB — PHOSPHORUS: PHOSPHORUS: 3.1 mg/dL (ref 2.5–4.5)

## 2017-02-09 LAB — RPR TITER: RPR Titer: 1:32 {titer} — ABNORMAL HIGH

## 2017-02-09 LAB — RPR: RPR Ser Ql: REACTIVE — AB

## 2017-02-09 LAB — HEPATITIS B SURFACE ANTIBODY,QUALITATIVE: Hep B S Ab: NONREACTIVE

## 2017-02-09 LAB — FLUORESCENT TREPONEMAL AB(FTA)-IGG-BLD: Fluorescent Treponemal ABS: REACTIVE — AB

## 2017-02-11 LAB — CT/NG RNA, TMA RECTAL
CHLAMYDIA TRACHOMATIS RNA: NOT DETECTED
NEISSERIA GONORRHOEAE RNA: NOT DETECTED

## 2017-02-22 ENCOUNTER — Encounter (INDEPENDENT_AMBULATORY_CARE_PROVIDER_SITE_OTHER): Payer: Self-pay | Admitting: *Deleted

## 2017-02-22 VITALS — BP 130/85 | HR 96 | Temp 98.5°F | Wt 166.0 lb

## 2017-02-22 DIAGNOSIS — Z006 Encounter for examination for normal comparison and control in clinical research program: Secondary | ICD-10-CM

## 2017-02-22 NOTE — Progress Notes (Signed)
Study: A Phase 2b/3 Double Blind Safety and Efficacy Study of Injectable Cabotegravir compared to Daily Oral Tenofovir Disoproxil Fumarate/Emtricitabine (TDF/FTC), For Pre-Exposure Prophylaxis in HIV-Uninfected Cisgender Men and Transgender Women who have sex with Men.  Medication: Investigational Injectable Cabotegravir/placebo compared to Truvada/placebo. Duration: Around 4 years.  Erik Cain is here for week 2. He had 2 days of diarrhea which have cleared up. He is being treated for latent syphilis and have received 2 or 3 treatments. He does have soreness in the gluteal region bilat from these injections today. Pill count revealed #45 remain. He is 100% adherent. He received $50 gift card for visit. Will see him in 2 weeks for follow up.

## 2017-02-23 LAB — COMPREHENSIVE METABOLIC PANEL
AG Ratio: 1.3 (calc) (ref 1.0–2.5)
ALBUMIN MSPROF: 4.3 g/dL (ref 3.6–5.1)
ALKALINE PHOSPHATASE (APISO): 85 U/L (ref 40–115)
ALT: 33 U/L (ref 9–46)
AST: 27 U/L (ref 10–40)
BUN: 22 mg/dL (ref 7–25)
CO2: 25 mmol/L (ref 20–32)
CREATININE: 1.13 mg/dL (ref 0.60–1.35)
Calcium: 9 mg/dL (ref 8.6–10.3)
Chloride: 101 mmol/L (ref 98–110)
GLUCOSE: 130 mg/dL — AB (ref 65–99)
Globulin: 3.2 g/dL (calc) (ref 1.9–3.7)
POTASSIUM: 3.7 mmol/L (ref 3.5–5.3)
SODIUM: 135 mmol/L (ref 135–146)
TOTAL PROTEIN: 7.5 g/dL (ref 6.1–8.1)
Total Bilirubin: 0.5 mg/dL (ref 0.2–1.2)

## 2017-02-23 LAB — CBC WITH DIFFERENTIAL/PLATELET
Basophils Absolute: 29 cells/uL (ref 0–200)
Basophils Relative: 0.5 %
EOS PCT: 1.7 %
Eosinophils Absolute: 97 cells/uL (ref 15–500)
HCT: 43.1 % (ref 38.5–50.0)
Hemoglobin: 14.8 g/dL (ref 13.2–17.1)
Lymphs Abs: 1208 cells/uL (ref 850–3900)
MCH: 29.1 pg (ref 27.0–33.0)
MCHC: 34.3 g/dL (ref 32.0–36.0)
MCV: 84.8 fL (ref 80.0–100.0)
MONOS PCT: 5.8 %
MPV: 9.2 fL (ref 7.5–12.5)
NEUTROS PCT: 70.8 %
Neutro Abs: 4036 cells/uL (ref 1500–7800)
PLATELETS: 394 10*3/uL (ref 140–400)
RBC: 5.08 10*6/uL (ref 4.20–5.80)
RDW: 12.1 % (ref 11.0–15.0)
TOTAL LYMPHOCYTE: 21.2 %
WBC mixed population: 331 cells/uL (ref 200–950)
WBC: 5.7 10*3/uL (ref 3.8–10.8)

## 2017-02-23 LAB — PHOSPHORUS: PHOSPHORUS: 3 mg/dL (ref 2.5–4.5)

## 2017-02-23 LAB — AMYLASE: AMYLASE: 41 U/L (ref 21–101)

## 2017-02-23 LAB — CK: CK TOTAL: 220 U/L — AB (ref 44–196)

## 2017-02-23 LAB — LIPASE: Lipase: 61 U/L — ABNORMAL HIGH (ref 7–60)

## 2017-02-23 LAB — HIV ANTIBODY (ROUTINE TESTING W REFLEX): HIV 1&2 Ab, 4th Generation: NONREACTIVE

## 2017-03-08 ENCOUNTER — Encounter (INDEPENDENT_AMBULATORY_CARE_PROVIDER_SITE_OTHER): Payer: Self-pay | Admitting: *Deleted

## 2017-03-08 VITALS — BP 126/85 | HR 85 | Temp 98.0°F | Wt 165.8 lb

## 2017-03-08 DIAGNOSIS — Z006 Encounter for examination for normal comparison and control in clinical research program: Secondary | ICD-10-CM

## 2017-03-08 IMAGING — CR DG ANKLE COMPLETE 3+V*R*
3 series · 3 of 3 positions shown · non-contrast
Comparison: None.

CLINICAL DATA: Acute right ankle pain after fall last night.
Initial encounter.

EXAM:
RIGHT ANKLE - COMPLETE 3+ VIEW

[AP]
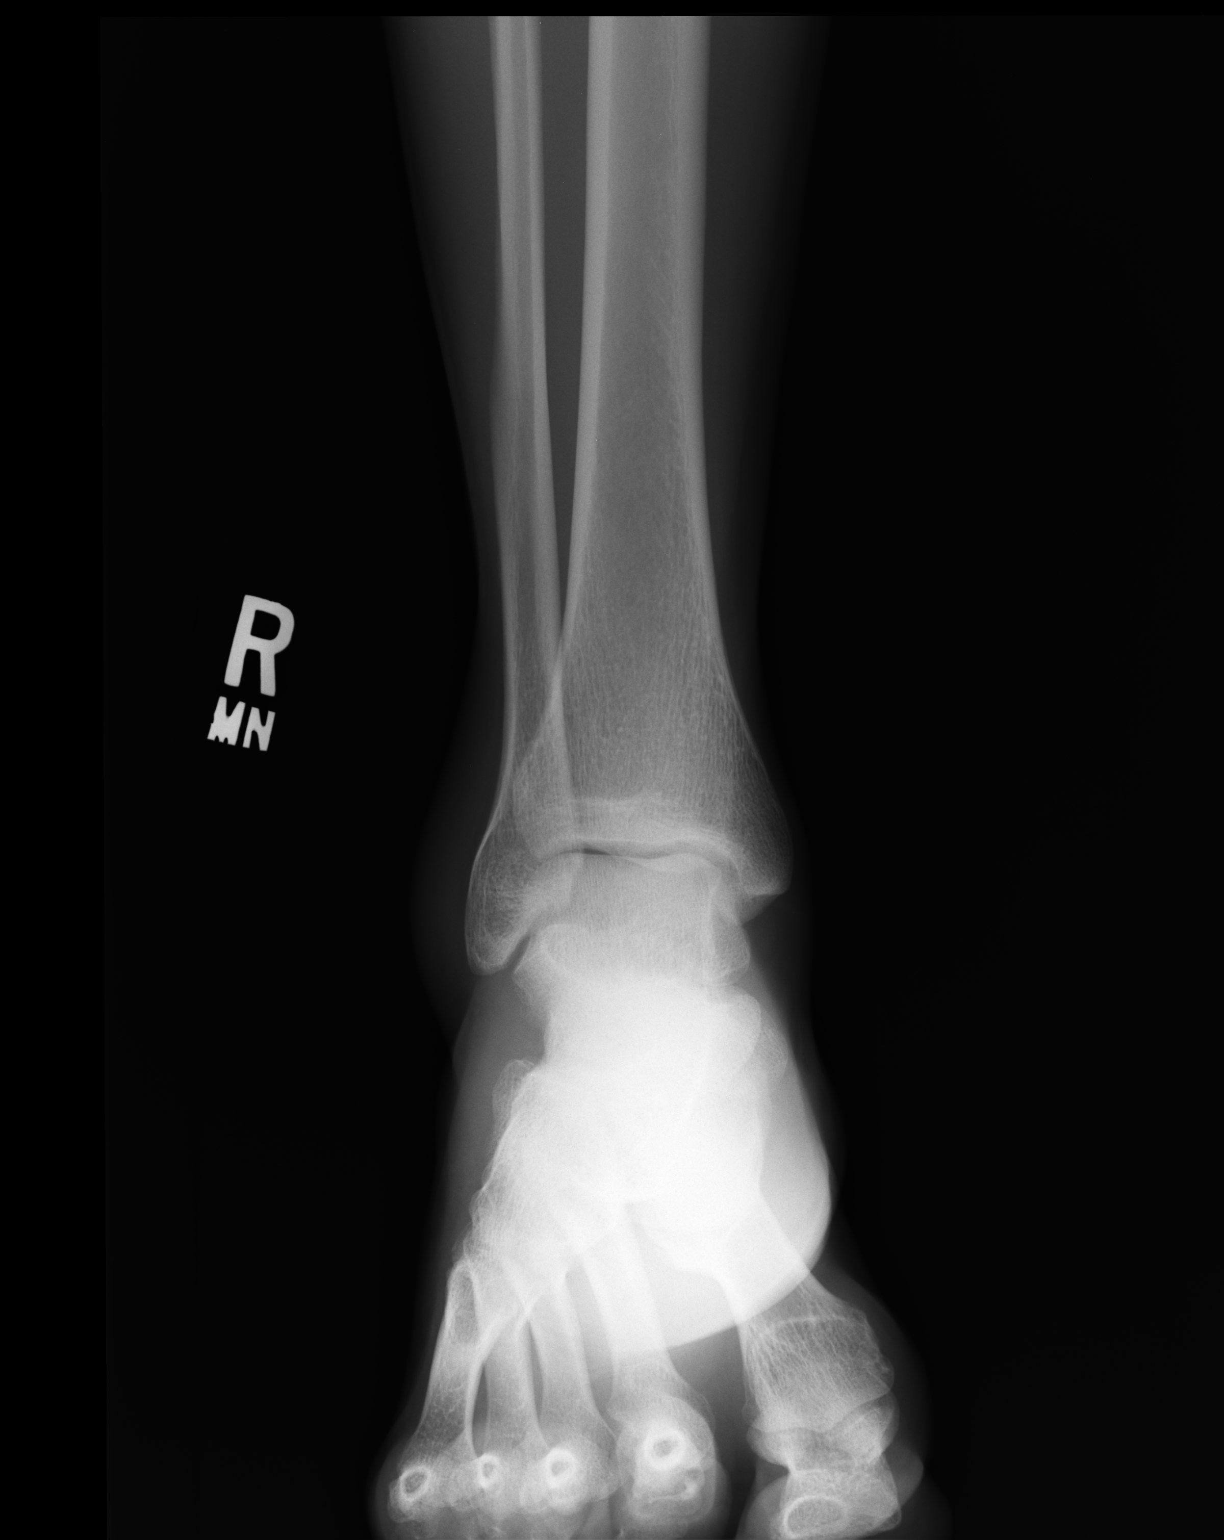

[ap obl int rot]
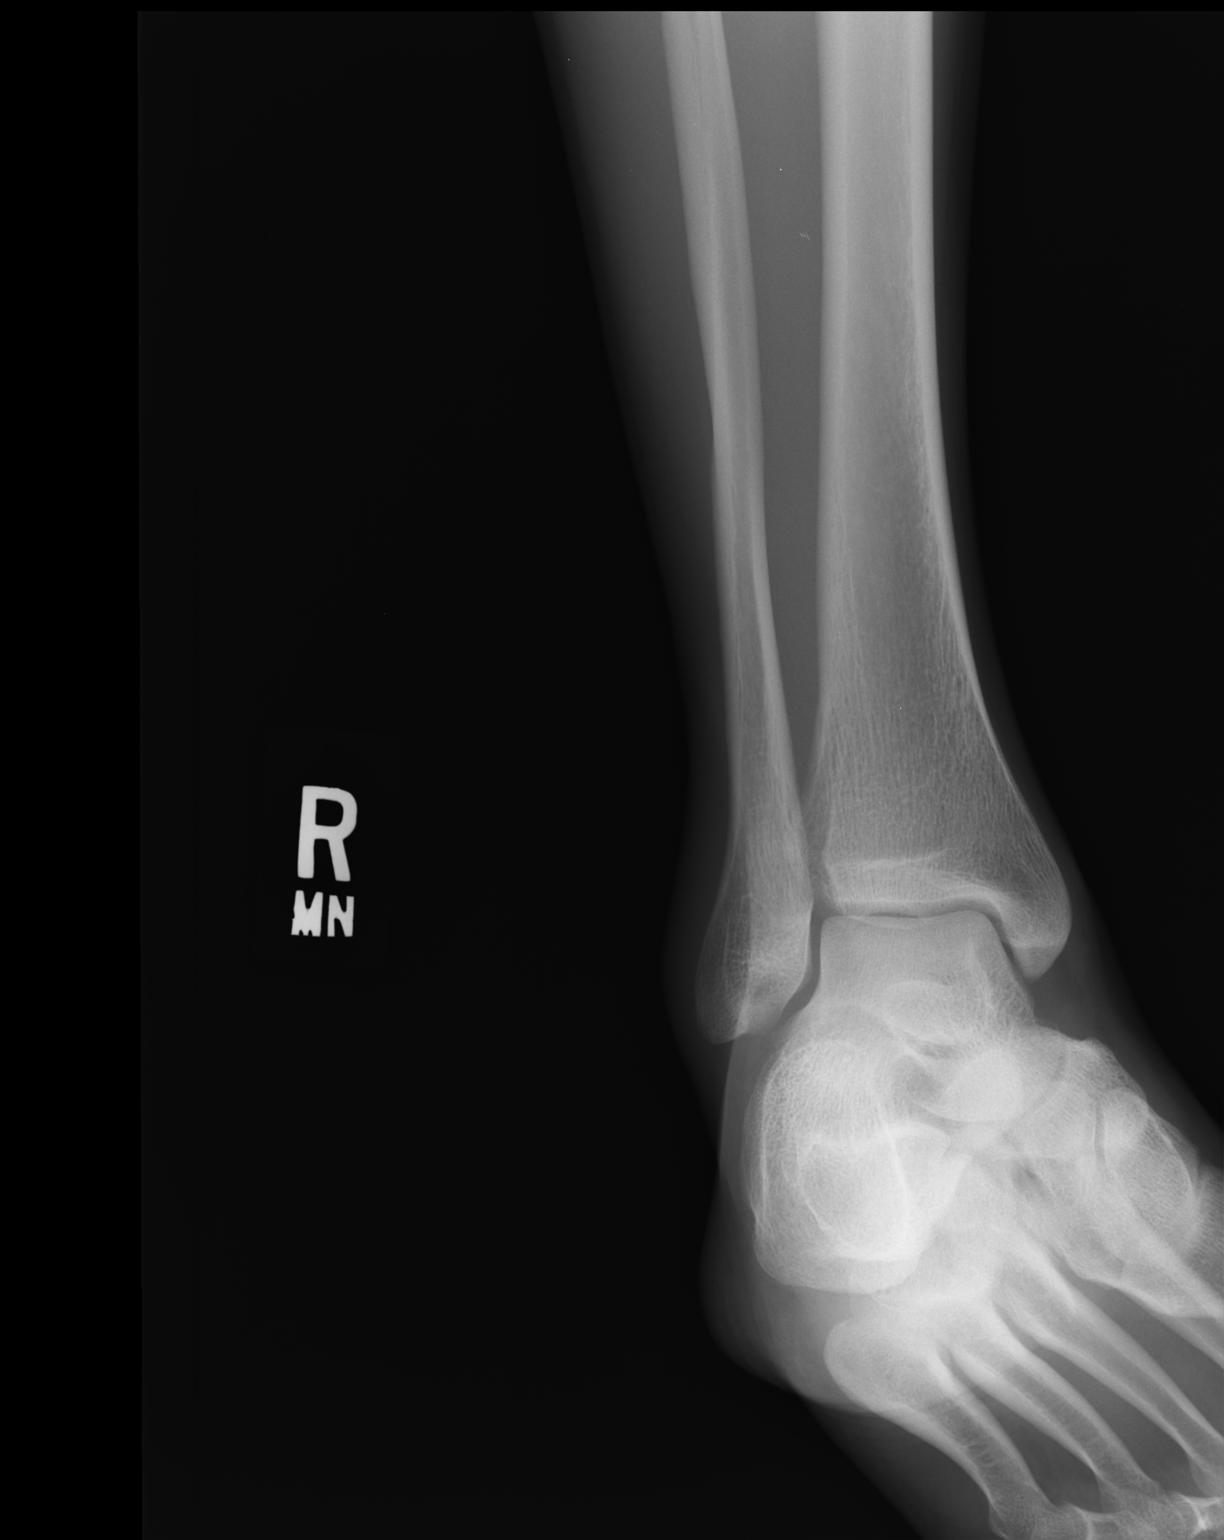

[lateral]
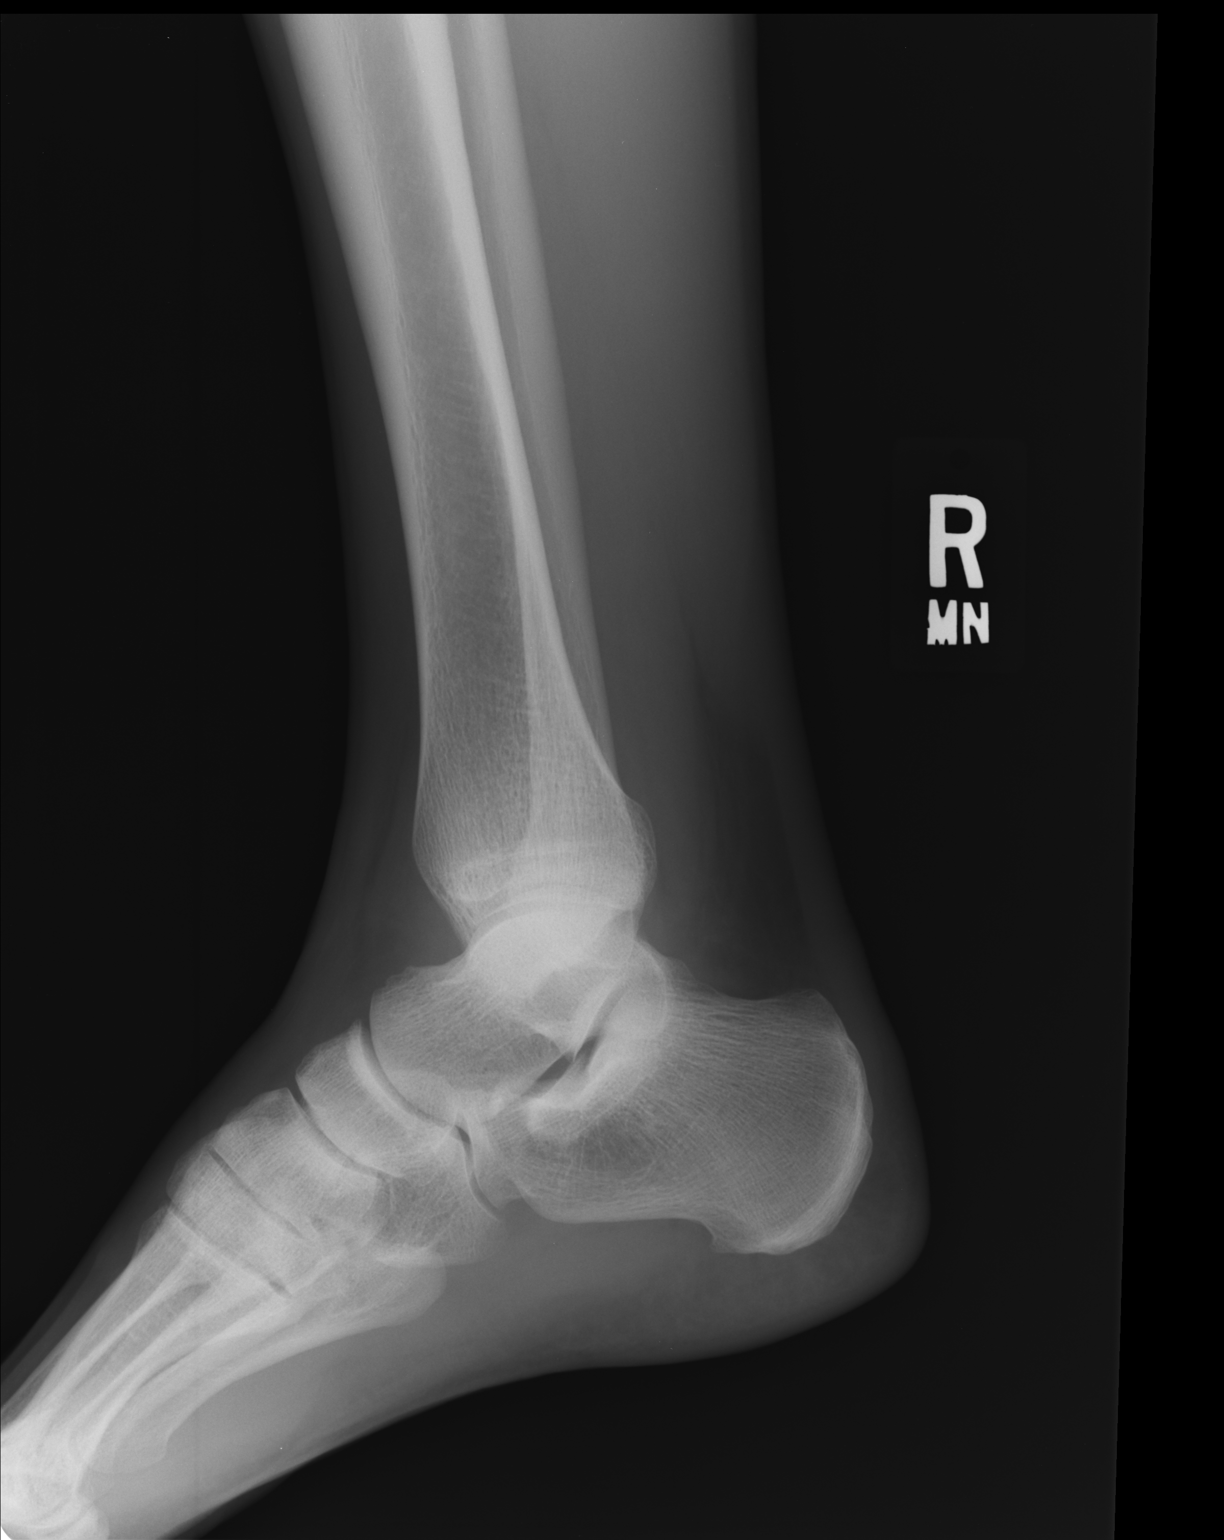

[3 of 3 positions shown; findings below may reference images not displayed]

FINDINGS: There is no evidence of fracture, dislocation, or joint effusion.
There is no evidence of arthropathy or other focal bone abnormality.
Soft tissue swelling is seen over the lateral malleolus.
IMPRESSION: No fracture or dislocation is noted. Soft tissue swelling is seen
over the lateral malleolus suggesting ligamentous injury.

## 2017-03-09 LAB — COMPREHENSIVE METABOLIC PANEL
AG RATIO: 1.4 (calc) (ref 1.0–2.5)
ALBUMIN MSPROF: 4.3 g/dL (ref 3.6–5.1)
ALT: 20 U/L (ref 9–46)
AST: 23 U/L (ref 10–40)
Alkaline phosphatase (APISO): 73 U/L (ref 40–115)
BILIRUBIN TOTAL: 0.7 mg/dL (ref 0.2–1.2)
BUN: 15 mg/dL (ref 7–25)
CALCIUM: 9.2 mg/dL (ref 8.6–10.3)
CHLORIDE: 99 mmol/L (ref 98–110)
CO2: 29 mmol/L (ref 20–32)
Creat: 1.31 mg/dL (ref 0.60–1.35)
GLOBULIN: 3 g/dL (ref 1.9–3.7)
Glucose, Bld: 102 mg/dL — ABNORMAL HIGH (ref 65–99)
POTASSIUM: 4.1 mmol/L (ref 3.5–5.3)
SODIUM: 136 mmol/L (ref 135–146)
TOTAL PROTEIN: 7.3 g/dL (ref 6.1–8.1)

## 2017-03-09 LAB — CBC WITH DIFFERENTIAL/PLATELET
BASOS PCT: 0.5 %
Basophils Absolute: 19 cells/uL (ref 0–200)
Eosinophils Absolute: 152 cells/uL (ref 15–500)
Eosinophils Relative: 4 %
HCT: 43.1 % (ref 38.5–50.0)
Hemoglobin: 14.8 g/dL (ref 13.2–17.1)
Lymphs Abs: 1034 cells/uL (ref 850–3900)
MCH: 29.4 pg (ref 27.0–33.0)
MCHC: 34.3 g/dL (ref 32.0–36.0)
MCV: 85.7 fL (ref 80.0–100.0)
MONOS PCT: 7.4 %
MPV: 9.7 fL (ref 7.5–12.5)
Neutro Abs: 2314 cells/uL (ref 1500–7800)
Neutrophils Relative %: 60.9 %
PLATELETS: 326 10*3/uL (ref 140–400)
RBC: 5.03 10*6/uL (ref 4.20–5.80)
RDW: 12.3 % (ref 11.0–15.0)
TOTAL LYMPHOCYTE: 27.2 %
WBC mixed population: 281 cells/uL (ref 200–950)
WBC: 3.8 10*3/uL (ref 3.8–10.8)

## 2017-03-09 LAB — HIV ANTIBODY (ROUTINE TESTING W REFLEX): HIV 1&2 Ab, 4th Generation: NONREACTIVE

## 2017-03-09 LAB — LIPASE: Lipase: 52 U/L (ref 7–60)

## 2017-03-09 LAB — AMYLASE: Amylase: 41 U/L (ref 21–101)

## 2017-03-09 LAB — PHOSPHORUS: Phosphorus: 3.4 mg/dL (ref 2.5–4.5)

## 2017-03-09 LAB — CK: Total CK: 128 U/L (ref 44–196)

## 2017-03-09 NOTE — Progress Notes (Signed)
Erik Cain is here for his week 4 [facility] He has been 100% adherent with his meds and should be able to move to step 2 for injections later this week. He denies any rpoblems with the meds or otherwise. He is coming in Friday for step 2.

## 2017-03-12 ENCOUNTER — Encounter: Payer: Self-pay | Admitting: *Deleted

## 2017-03-22 ENCOUNTER — Encounter (INDEPENDENT_AMBULATORY_CARE_PROVIDER_SITE_OTHER): Payer: Self-pay | Admitting: *Deleted

## 2017-03-22 VITALS — BP 125/91 | HR 75 | Temp 97.9°F | Wt 167.2 lb

## 2017-03-22 DIAGNOSIS — Z006 Encounter for examination for normal comparison and control in clinical research program: Secondary | ICD-10-CM

## 2017-03-22 NOTE — Progress Notes (Signed)
Erik Cain was here for his week 5 visit for HPTN. He denies any new problems or concerns. His first injection was given in his rt butt. After verifying eligibility for the injection. His adherence was 100%. He will be returning in 1 wek for the next visit.

## 2017-03-23 LAB — HIV ANTIBODY (ROUTINE TESTING W REFLEX): HIV 1&2 Ab, 4th Generation: NONREACTIVE

## 2017-03-29 ENCOUNTER — Encounter (INDEPENDENT_AMBULATORY_CARE_PROVIDER_SITE_OTHER): Payer: Self-pay | Admitting: *Deleted

## 2017-03-29 VITALS — BP 116/83 | HR 96 | Temp 98.2°F | Wt 164.0 lb

## 2017-03-29 DIAGNOSIS — Z006 Encounter for examination for normal comparison and control in clinical research program: Secondary | ICD-10-CM

## 2017-03-29 NOTE — Progress Notes (Signed)
Erik Cain is here for his week 6 visit [facility]. He denies any injection site reaction or new problems. He will be returning on 1/25 for his next injection.

## 2017-03-30 LAB — CBC WITH DIFFERENTIAL/PLATELET
BASOS ABS: 51 {cells}/uL (ref 0–200)
Basophils Relative: 0.6 %
EOS PCT: 0.8 %
Eosinophils Absolute: 68 cells/uL (ref 15–500)
HEMATOCRIT: 47.9 % (ref 38.5–50.0)
HEMOGLOBIN: 16.1 g/dL (ref 13.2–17.1)
LYMPHS ABS: 1063 {cells}/uL (ref 850–3900)
MCH: 29.6 pg (ref 27.0–33.0)
MCHC: 33.6 g/dL (ref 32.0–36.0)
MCV: 88.1 fL (ref 80.0–100.0)
MPV: 10.1 fL (ref 7.5–12.5)
Monocytes Relative: 5.2 %
NEUTROS ABS: 6877 {cells}/uL (ref 1500–7800)
Neutrophils Relative %: 80.9 %
Platelets: 320 10*3/uL (ref 140–400)
RBC: 5.44 10*6/uL (ref 4.20–5.80)
RDW: 13 % (ref 11.0–15.0)
Total Lymphocyte: 12.5 %
WBC mixed population: 442 cells/uL (ref 200–950)
WBC: 8.5 10*3/uL (ref 3.8–10.8)

## 2017-03-30 LAB — TEST AUTHORIZATION 2

## 2017-03-30 LAB — COMPLETE METABOLIC PANEL WITH GFR
AG Ratio: 1.5 (calc) (ref 1.0–2.5)
ALBUMIN MSPROF: 4.6 g/dL (ref 3.6–5.1)
ALKALINE PHOSPHATASE (APISO): 78 U/L (ref 40–115)
ALT: 19 U/L (ref 9–46)
AST: 22 U/L (ref 10–40)
BILIRUBIN TOTAL: 0.8 mg/dL (ref 0.2–1.2)
BUN / CREAT RATIO: 11 (calc) (ref 6–22)
BUN: 17 mg/dL (ref 7–25)
CHLORIDE: 101 mmol/L (ref 98–110)
CO2: 26 mmol/L (ref 20–32)
Calcium: 9.4 mg/dL (ref 8.6–10.3)
Creat: 1.52 mg/dL — ABNORMAL HIGH (ref 0.60–1.35)
GFR, Est African American: 63 mL/min/{1.73_m2} (ref >=60)
GFR, Est Non African American: 55 mL/min/{1.73_m2} — ABNORMAL LOW (ref >=60)
GLOBULIN: 3 g/dL (ref 1.9–3.7)
GLUCOSE: 92 mg/dL (ref 65–99)
Potassium: 4.6 mmol/L (ref 3.5–5.3)
SODIUM: 139 mmol/L (ref 135–146)
Total Protein: 7.6 g/dL (ref 6.1–8.1)

## 2017-03-30 LAB — HIV ANTIBODY (ROUTINE TESTING W REFLEX): HIV: NONREACTIVE

## 2017-03-30 LAB — PHOSPHORUS: Phosphorus: 3.1 mg/dL (ref 2.5–4.5)

## 2017-03-30 LAB — LIPASE: LIPASE: 55 U/L (ref 7–60)

## 2017-03-30 LAB — EXTRA LAV TOP TUBE

## 2017-03-30 LAB — TEST AUTHORIZATION

## 2017-03-30 LAB — CK: Total CK: 110 U/L (ref 44–196)

## 2017-03-30 LAB — AMYLASE: Amylase: 48 U/L (ref 21–101)

## 2017-04-16 ENCOUNTER — Encounter (INDEPENDENT_AMBULATORY_CARE_PROVIDER_SITE_OTHER): Payer: Self-pay | Admitting: *Deleted

## 2017-04-16 VITALS — BP 120/85 | HR 89 | Temp 98.1°F | Wt 171.0 lb

## 2017-04-16 DIAGNOSIS — Z006 Encounter for examination for normal comparison and control in clinical research program: Secondary | ICD-10-CM

## 2017-04-16 LAB — CBC WITH DIFFERENTIAL/PLATELET
BASOS ABS: 29 {cells}/uL (ref 0–200)
BASOS PCT: 0.7 %
EOS ABS: 160 {cells}/uL (ref 15–500)
Eosinophils Relative: 3.8 %
HEMATOCRIT: 46.5 % (ref 38.5–50.0)
Hemoglobin: 16.1 g/dL (ref 13.2–17.1)
Lymphs Abs: 1268 cells/uL (ref 850–3900)
MCH: 29.8 pg (ref 27.0–33.0)
MCHC: 34.6 g/dL (ref 32.0–36.0)
MCV: 86 fL (ref 80.0–100.0)
MONOS PCT: 5.9 %
MPV: 9.7 fL (ref 7.5–12.5)
Neutro Abs: 2495 cells/uL (ref 1500–7800)
Neutrophils Relative %: 59.4 %
Platelets: 290 10*3/uL (ref 140–400)
RBC: 5.41 10*6/uL (ref 4.20–5.80)
RDW: 13 % (ref 11.0–15.0)
Total Lymphocyte: 30.2 %
WBC: 4.2 10*3/uL (ref 3.8–10.8)
WBCMIX: 248 {cells}/uL (ref 200–950)

## 2017-04-16 NOTE — Progress Notes (Signed)
Annette StableBill was here for his week 9 visit for [facility]. He denies any new problems or concerns. His adherence is 100 % with the study med. He was given an injection of study med in his rt buttock without problem. He will be returning next week for followup.

## 2017-04-17 LAB — COMPREHENSIVE METABOLIC PANEL
AG RATIO: 1.5 (calc) (ref 1.0–2.5)
ALKALINE PHOSPHATASE (APISO): 73 U/L (ref 40–115)
ALT: 21 U/L (ref 9–46)
AST: 21 U/L (ref 10–40)
Albumin: 4.3 g/dL (ref 3.6–5.1)
BILIRUBIN TOTAL: 0.5 mg/dL (ref 0.2–1.2)
BUN: 21 mg/dL (ref 7–25)
CALCIUM: 8.8 mg/dL (ref 8.6–10.3)
CHLORIDE: 104 mmol/L (ref 98–110)
CO2: 26 mmol/L (ref 20–32)
Creat: 1.23 mg/dL (ref 0.60–1.35)
GLOBULIN: 2.9 g/dL (ref 1.9–3.7)
GLUCOSE: 118 mg/dL — AB (ref 65–99)
Potassium: 4.3 mmol/L (ref 3.5–5.3)
Sodium: 139 mmol/L (ref 135–146)
Total Protein: 7.2 g/dL (ref 6.1–8.1)

## 2017-04-17 LAB — AMYLASE: Amylase: 52 U/L (ref 21–101)

## 2017-04-17 LAB — HIV ANTIBODY (ROUTINE TESTING W REFLEX): HIV 1&2 Ab, 4th Generation: NONREACTIVE

## 2017-04-17 LAB — CK: Total CK: 89 U/L (ref 44–196)

## 2017-04-17 LAB — LIPASE: LIPASE: 71 U/L — AB (ref 7–60)

## 2017-04-17 LAB — PHOSPHORUS: Phosphorus: 3.5 mg/dL (ref 2.5–4.5)

## 2017-04-22 ENCOUNTER — Encounter (INDEPENDENT_AMBULATORY_CARE_PROVIDER_SITE_OTHER): Payer: Self-pay | Admitting: *Deleted

## 2017-04-22 VITALS — BP 125/86 | HR 83 | Temp 97.7°F | Wt 171.0 lb

## 2017-04-22 DIAGNOSIS — Z006 Encounter for examination for normal comparison and control in clinical research program: Secondary | ICD-10-CM

## 2017-04-22 NOTE — Progress Notes (Signed)
Erik Cain was here for his week 10 visit [facility]. He said he had no problems with the last injection or any new issues or medications. Adherence is excellent. He will return in March for the next injection visit.

## 2017-04-23 LAB — COMPREHENSIVE METABOLIC PANEL
AG Ratio: 1.5 (calc) (ref 1.0–2.5)
ALT: 24 U/L (ref 9–46)
AST: 24 U/L (ref 10–40)
Albumin: 4.4 g/dL (ref 3.6–5.1)
Alkaline phosphatase (APISO): 75 U/L (ref 40–115)
BUN: 17 mg/dL (ref 7–25)
CO2: 29 mmol/L (ref 20–32)
CREATININE: 1.31 mg/dL (ref 0.60–1.35)
Calcium: 9.3 mg/dL (ref 8.6–10.3)
Chloride: 103 mmol/L (ref 98–110)
GLOBULIN: 2.9 g/dL (ref 1.9–3.7)
Glucose, Bld: 112 mg/dL — ABNORMAL HIGH (ref 65–99)
Potassium: 4 mmol/L (ref 3.5–5.3)
SODIUM: 138 mmol/L (ref 135–146)
TOTAL PROTEIN: 7.3 g/dL (ref 6.1–8.1)
Total Bilirubin: 0.6 mg/dL (ref 0.2–1.2)

## 2017-04-23 LAB — CBC WITH DIFFERENTIAL/PLATELET
BASOS ABS: 32 {cells}/uL (ref 0–200)
Basophils Relative: 0.9 %
EOS ABS: 119 {cells}/uL (ref 15–500)
Eosinophils Relative: 3.4 %
HEMATOCRIT: 46.1 % (ref 38.5–50.0)
Hemoglobin: 16.1 g/dL (ref 13.2–17.1)
LYMPHS ABS: 1344 {cells}/uL (ref 850–3900)
MCH: 30.1 pg (ref 27.0–33.0)
MCHC: 34.9 g/dL (ref 32.0–36.0)
MCV: 86.2 fL (ref 80.0–100.0)
MPV: 9.9 fL (ref 7.5–12.5)
Monocytes Relative: 8.2 %
NEUTROS PCT: 49.1 %
Neutro Abs: 1719 cells/uL (ref 1500–7800)
Platelets: 298 10*3/uL (ref 140–400)
RBC: 5.35 10*6/uL (ref 4.20–5.80)
RDW: 13 % (ref 11.0–15.0)
Total Lymphocyte: 38.4 %
WBC: 3.5 10*3/uL — ABNORMAL LOW (ref 3.8–10.8)
WBCMIX: 287 {cells}/uL (ref 200–950)

## 2017-04-23 LAB — LIPASE: LIPASE: 59 U/L (ref 7–60)

## 2017-04-23 LAB — HIV ANTIBODY (ROUTINE TESTING W REFLEX): HIV 1&2 Ab, 4th Generation: NONREACTIVE

## 2017-04-23 LAB — CK: CK TOTAL: 92 U/L (ref 44–196)

## 2017-04-23 LAB — PHOSPHORUS: Phosphorus: 3.6 mg/dL (ref 2.5–4.5)

## 2017-04-23 LAB — AMYLASE: AMYLASE: 51 U/L (ref 21–101)

## 2017-06-07 ENCOUNTER — Encounter (INDEPENDENT_AMBULATORY_CARE_PROVIDER_SITE_OTHER): Payer: Self-pay | Admitting: *Deleted

## 2017-06-07 VITALS — BP 120/83 | HR 83 | Temp 98.2°F | Wt 161.5 lb

## 2017-06-07 DIAGNOSIS — Z006 Encounter for examination for normal comparison and control in clinical research program: Secondary | ICD-10-CM

## 2017-06-07 NOTE — Progress Notes (Signed)
Study: A Phase 2b/3 Double Blind Safety and Efficacy Study of Injectable Cabotegravir compared to Daily Oral Tenofovir Disoproxil Fumarate/Emtricitabine (TDF/FTC), For Pre-Exposure Prophylaxis in HIV-Uninfected Cisgender Men and Transgender Women who have sex with Men.  Medication: Investigational Injectable Cabotegravir/placebo compared to Truvada/placebo. Duration: Around 4 years.  Erik Cain is here for week 17 visit. Denies any new problems or concerns. Worked out at gym this morning prior to visit. States that he did not hydrate well yesterday and had a few drinks to celebrate [employer] [place] Day. Adherence is 96% with his oral study medication. States 2 unprotected sexual encounters. Denies any acute HIV symptoms. Rapid HIV non-reactive. Cabotegravir/placebo injection given (R) buttock. Site unremarkable. He will return in 2 weeks for safety visit.

## 2017-06-08 LAB — COMPREHENSIVE METABOLIC PANEL
AG RATIO: 1.6 (calc) (ref 1.0–2.5)
ALKALINE PHOSPHATASE (APISO): 77 U/L (ref 40–115)
ALT: 44 U/L (ref 9–46)
AST: 63 U/L — AB (ref 10–40)
Albumin: 4.5 g/dL (ref 3.6–5.1)
BUN: 19 mg/dL (ref 7–25)
CHLORIDE: 102 mmol/L (ref 98–110)
CO2: 25 mmol/L (ref 20–32)
Calcium: 9.1 mg/dL (ref 8.6–10.3)
Creat: 1.25 mg/dL (ref 0.60–1.35)
GLOBULIN: 2.9 g/dL (ref 1.9–3.7)
GLUCOSE: 79 mg/dL (ref 65–99)
Potassium: 4.1 mmol/L (ref 3.5–5.3)
Sodium: 137 mmol/L (ref 135–146)
Total Bilirubin: 0.7 mg/dL (ref 0.2–1.2)
Total Protein: 7.4 g/dL (ref 6.1–8.1)

## 2017-06-08 LAB — CBC WITH DIFFERENTIAL/PLATELET
BASOS ABS: 29 {cells}/uL (ref 0–200)
BASOS PCT: 0.8 %
EOS ABS: 212 {cells}/uL (ref 15–500)
Eosinophils Relative: 5.9 %
HCT: 45.7 % (ref 38.5–50.0)
HEMOGLOBIN: 16.3 g/dL (ref 13.2–17.1)
Lymphs Abs: 1274 cells/uL (ref 850–3900)
MCH: 30.7 pg (ref 27.0–33.0)
MCHC: 35.7 g/dL (ref 32.0–36.0)
MCV: 86.1 fL (ref 80.0–100.0)
MONOS PCT: 7.6 %
MPV: 9.8 fL (ref 7.5–12.5)
NEUTROS ABS: 1811 {cells}/uL (ref 1500–7800)
Neutrophils Relative %: 50.3 %
Platelets: 292 10*3/uL (ref 140–400)
RBC: 5.31 10*6/uL (ref 4.20–5.80)
RDW: 12.8 % (ref 11.0–15.0)
TOTAL LYMPHOCYTE: 35.4 %
WBC: 3.6 10*3/uL — ABNORMAL LOW (ref 3.8–10.8)
WBCMIX: 274 {cells}/uL (ref 200–950)

## 2017-06-08 LAB — CK: Total CK: 2522 U/L — ABNORMAL HIGH (ref 44–196)

## 2017-06-08 LAB — FLUORESCENT TREPONEMAL AB(FTA)-IGG-BLD: FLUORESCENT TREPONEMAL ABS: REACTIVE — AB

## 2017-06-08 LAB — HIV ANTIBODY (ROUTINE TESTING W REFLEX): HIV 1&2 Ab, 4th Generation: NONREACTIVE

## 2017-06-08 LAB — AMYLASE: Amylase: 43 U/L (ref 21–101)

## 2017-06-08 LAB — PHOSPHORUS: PHOSPHORUS: 2.3 mg/dL — AB (ref 2.5–4.5)

## 2017-06-08 LAB — RPR TITER

## 2017-06-08 LAB — LIPASE: LIPASE: 61 U/L — AB (ref 7–60)

## 2017-06-08 LAB — RPR: RPR Ser Ql: REACTIVE — AB

## 2017-06-21 ENCOUNTER — Encounter (INDEPENDENT_AMBULATORY_CARE_PROVIDER_SITE_OTHER): Payer: Self-pay | Admitting: *Deleted

## 2017-06-21 VITALS — BP 126/86 | HR 87 | Temp 98.3°F | Wt 167.0 lb

## 2017-06-21 DIAGNOSIS — Z006 Encounter for examination for normal comparison and control in clinical research program: Secondary | ICD-10-CM

## 2017-06-21 NOTE — Progress Notes (Signed)
Study: A Phase 2b/3 Double Blind Safety and Efficacy Study of Injectable Cabotegravir compared to Daily Oral Tenofovir Disoproxil Fumarate/Emtricitabine (TDF/FTC), For Pre-Exposure Prophylaxis in HIV-Uninfected Cisgender Men and Transgender Women who have sex with Men.  Medication: Investigational Injectable Cabotegravir/placebo compared to Truvada/placebo. Duration: Around 4 years.  Erik Cain is here for week 19 visit. Denies any injection site reaction. States that he has been feeling depressed for the past couple of months. He feels that this is related to work and personal stressors/issues. Started seeing a counselor last week. CPK was elevated at his last visit. Had just started working out at gym with a Systems analystpersonal trainer and worked out the morning of the visit. CPK along with other safety labs obtained today. Last worked out on 3/25.  He will return in May for his next injection visit.

## 2017-06-22 LAB — COMPREHENSIVE METABOLIC PANEL
AG RATIO: 1.7 (calc) (ref 1.0–2.5)
ALBUMIN MSPROF: 4.5 g/dL (ref 3.6–5.1)
ALKALINE PHOSPHATASE (APISO): 72 U/L (ref 40–115)
ALT: 41 U/L (ref 9–46)
AST: 28 U/L (ref 10–40)
BILIRUBIN TOTAL: 0.6 mg/dL (ref 0.2–1.2)
BUN: 14 mg/dL (ref 7–25)
CHLORIDE: 103 mmol/L (ref 98–110)
CO2: 29 mmol/L (ref 20–32)
CREATININE: 1.16 mg/dL (ref 0.60–1.35)
Calcium: 9.2 mg/dL (ref 8.6–10.3)
GLOBULIN: 2.6 g/dL (ref 1.9–3.7)
Glucose, Bld: 100 mg/dL — ABNORMAL HIGH (ref 65–99)
POTASSIUM: 4.6 mmol/L (ref 3.5–5.3)
Sodium: 138 mmol/L (ref 135–146)
Total Protein: 7.1 g/dL (ref 6.1–8.1)

## 2017-06-22 LAB — CBC WITH DIFFERENTIAL/PLATELET
Basophils Absolute: 41 cells/uL (ref 0–200)
Basophils Relative: 0.9 %
Eosinophils Absolute: 90 cells/uL (ref 15–500)
Eosinophils Relative: 2 %
HCT: 46.8 % (ref 38.5–50.0)
Hemoglobin: 16.2 g/dL (ref 13.2–17.1)
Lymphs Abs: 1089 cells/uL (ref 850–3900)
MCH: 29.8 pg (ref 27.0–33.0)
MCHC: 34.6 g/dL (ref 32.0–36.0)
MCV: 86.2 fL (ref 80.0–100.0)
MONOS PCT: 5.5 %
MPV: 9.8 fL (ref 7.5–12.5)
NEUTROS PCT: 67.4 %
Neutro Abs: 3033 cells/uL (ref 1500–7800)
PLATELETS: 315 10*3/uL (ref 140–400)
RBC: 5.43 10*6/uL (ref 4.20–5.80)
RDW: 12.8 % (ref 11.0–15.0)
TOTAL LYMPHOCYTE: 24.2 %
WBC mixed population: 248 cells/uL (ref 200–950)
WBC: 4.5 10*3/uL (ref 3.8–10.8)

## 2017-06-22 LAB — PHOSPHORUS: Phosphorus: 3.9 mg/dL (ref 2.5–4.5)

## 2017-06-22 LAB — CK: Total CK: 218 U/L — ABNORMAL HIGH (ref 44–196)

## 2017-06-22 LAB — LIPASE: Lipase: 67 U/L — ABNORMAL HIGH (ref 7–60)

## 2017-06-22 LAB — HIV ANTIBODY (ROUTINE TESTING W REFLEX): HIV 1&2 Ab, 4th Generation: NONREACTIVE

## 2017-06-22 LAB — AMYLASE: Amylase: 47 U/L (ref 21–101)

## 2017-07-29 ENCOUNTER — Encounter: Payer: Self-pay | Admitting: *Deleted

## 2017-08-02 ENCOUNTER — Encounter (INDEPENDENT_AMBULATORY_CARE_PROVIDER_SITE_OTHER): Payer: Self-pay

## 2017-08-02 VITALS — BP 130/90 | HR 78 | Temp 97.7°F | Wt 166.0 lb

## 2017-08-02 DIAGNOSIS — Z006 Encounter for examination for normal comparison and control in clinical research program: Secondary | ICD-10-CM

## 2017-08-02 NOTE — Progress Notes (Signed)
Patient presented for routine injection visit for [id number] week 25. Voiced no complaints other than stent of viral gastroenteritis for 2 days on 07/20/17. Injection given without issue. Scheduled for follow up in two weeks on 08/17/17.

## 2017-08-03 LAB — CBC WITH DIFFERENTIAL/PLATELET
BASOS ABS: 40 {cells}/uL (ref 0–200)
Basophils Relative: 0.6 %
Eosinophils Absolute: 121 cells/uL (ref 15–500)
Eosinophils Relative: 1.8 %
HCT: 46.5 % (ref 38.5–50.0)
Hemoglobin: 16.4 g/dL (ref 13.2–17.1)
Lymphs Abs: 1494 cells/uL (ref 850–3900)
MCH: 30.8 pg (ref 27.0–33.0)
MCHC: 35.3 g/dL (ref 32.0–36.0)
MCV: 87.4 fL (ref 80.0–100.0)
MPV: 9.8 fL (ref 7.5–12.5)
Monocytes Relative: 3.7 %
Neutro Abs: 4797 cells/uL (ref 1500–7800)
Neutrophils Relative %: 71.6 %
Platelets: 300 10*3/uL (ref 140–400)
RBC: 5.32 10*6/uL (ref 4.20–5.80)
RDW: 12.6 % (ref 11.0–15.0)
TOTAL LYMPHOCYTE: 22.3 %
WBC: 6.7 10*3/uL (ref 3.8–10.8)
WBCMIX: 248 {cells}/uL (ref 200–950)

## 2017-08-03 LAB — COMPREHENSIVE METABOLIC PANEL
AG Ratio: 1.7 (calc) (ref 1.0–2.5)
ALBUMIN MSPROF: 4.6 g/dL (ref 3.6–5.1)
ALKALINE PHOSPHATASE (APISO): 72 U/L (ref 40–115)
ALT: 16 U/L (ref 9–46)
AST: 21 U/L (ref 10–40)
BUN: 14 mg/dL (ref 7–25)
CALCIUM: 9.3 mg/dL (ref 8.6–10.3)
CHLORIDE: 101 mmol/L (ref 98–110)
CO2: 30 mmol/L (ref 20–32)
Creat: 1.24 mg/dL (ref 0.60–1.35)
GLOBULIN: 2.7 g/dL (ref 1.9–3.7)
Glucose, Bld: 101 mg/dL — ABNORMAL HIGH (ref 65–99)
POTASSIUM: 4.3 mmol/L (ref 3.5–5.3)
Sodium: 140 mmol/L (ref 135–146)
Total Bilirubin: 0.9 mg/dL (ref 0.2–1.2)
Total Protein: 7.3 g/dL (ref 6.1–8.1)

## 2017-08-03 LAB — PHOSPHORUS: Phosphorus: 3.5 mg/dL (ref 2.5–4.5)

## 2017-08-03 LAB — HIV ANTIBODY (ROUTINE TESTING W REFLEX): HIV 1&2 Ab, 4th Generation: NONREACTIVE

## 2017-08-03 LAB — AMYLASE: AMYLASE: 39 U/L (ref 21–101)

## 2017-08-03 LAB — CK: Total CK: 116 U/L (ref 44–196)

## 2017-08-03 LAB — LIPASE: Lipase: 49 U/L (ref 7–60)

## 2017-08-18 ENCOUNTER — Encounter (INDEPENDENT_AMBULATORY_CARE_PROVIDER_SITE_OTHER): Payer: Self-pay

## 2017-08-18 VITALS — BP 123/85 | HR 82 | Temp 98.2°F | Wt 163.8 lb

## 2017-08-18 DIAGNOSIS — Z006 Encounter for examination for normal comparison and control in clinical research program: Secondary | ICD-10-CM

## 2017-08-18 NOTE — Progress Notes (Signed)
Patient presented for follow-up safety visit for [id number] study. Stated no complaints of symptoms at the injection site. No new medical issues or changes in medication. Patient scheduled next injection visit on 09/27/17.

## 2017-08-19 LAB — COMPREHENSIVE METABOLIC PANEL
AG RATIO: 1.6 (calc) (ref 1.0–2.5)
ALT: 22 U/L (ref 9–46)
AST: 21 U/L (ref 10–40)
Albumin: 4.2 g/dL (ref 3.6–5.1)
Alkaline phosphatase (APISO): 76 U/L (ref 40–115)
BILIRUBIN TOTAL: 0.3 mg/dL (ref 0.2–1.2)
BUN: 17 mg/dL (ref 7–25)
CALCIUM: 8.9 mg/dL (ref 8.6–10.3)
CHLORIDE: 106 mmol/L (ref 98–110)
CO2: 25 mmol/L (ref 20–32)
Creat: 1.07 mg/dL (ref 0.60–1.35)
GLOBULIN: 2.6 g/dL (ref 1.9–3.7)
GLUCOSE: 102 mg/dL — AB (ref 65–99)
Potassium: 4 mmol/L (ref 3.5–5.3)
Sodium: 139 mmol/L (ref 135–146)
Total Protein: 6.8 g/dL (ref 6.1–8.1)

## 2017-08-19 LAB — CBC WITH DIFFERENTIAL/PLATELET
BASOS ABS: 50 {cells}/uL (ref 0–200)
Basophils Relative: 1.1 %
Eosinophils Absolute: 234 cells/uL (ref 15–500)
Eosinophils Relative: 5.2 %
HCT: 45 % (ref 38.5–50.0)
Hemoglobin: 15.7 g/dL (ref 13.2–17.1)
Lymphs Abs: 1643 cells/uL (ref 850–3900)
MCH: 30.3 pg (ref 27.0–33.0)
MCHC: 34.9 g/dL (ref 32.0–36.0)
MCV: 86.9 fL (ref 80.0–100.0)
MONOS PCT: 8.5 %
MPV: 9.7 fL (ref 7.5–12.5)
NEUTROS ABS: 2192 {cells}/uL (ref 1500–7800)
NEUTROS PCT: 48.7 %
PLATELETS: 305 10*3/uL (ref 140–400)
RBC: 5.18 10*6/uL (ref 4.20–5.80)
RDW: 12.3 % (ref 11.0–15.0)
TOTAL LYMPHOCYTE: 36.5 %
WBC mixed population: 383 cells/uL (ref 200–950)
WBC: 4.5 10*3/uL (ref 3.8–10.8)

## 2017-08-19 LAB — AMYLASE: Amylase: 55 U/L (ref 21–101)

## 2017-08-19 LAB — PHOSPHORUS: Phosphorus: 4.2 mg/dL (ref 2.5–4.5)

## 2017-08-19 LAB — CK: Total CK: 120 U/L (ref 44–196)

## 2017-08-19 LAB — HIV ANTIBODY (ROUTINE TESTING W REFLEX): HIV: NONREACTIVE

## 2017-08-19 LAB — LIPASE: LIPASE: 109 U/L — AB (ref 7–60)

## 2017-09-27 ENCOUNTER — Encounter (INDEPENDENT_AMBULATORY_CARE_PROVIDER_SITE_OTHER): Payer: Self-pay

## 2017-09-27 VITALS — BP 131/84 | HR 75 | Temp 98.0°F | Wt 165.5 lb

## 2017-09-27 DIAGNOSIS — Z006 Encounter for examination for normal comparison and control in clinical research program: Secondary | ICD-10-CM

## 2017-09-27 NOTE — Progress Notes (Signed)
Participant here for [id number] study visit. Vitals within normal range. No complaints voiced. Rapid HIV negative. Participant received injection of cabotegravir/placebo in right glute at 1057 as well as new dispense of oral IP. Participant is scheduled for follow-up on 7/16.

## 2017-09-28 LAB — RPR TITER: RPR Titer: 1:1 {titer} — ABNORMAL HIGH

## 2017-09-28 LAB — CBC WITH DIFFERENTIAL/PLATELET
BASOS ABS: 42 {cells}/uL (ref 0–200)
Basophils Relative: 1 %
EOS PCT: 3.8 %
Eosinophils Absolute: 160 cells/uL (ref 15–500)
HEMATOCRIT: 44.5 % (ref 38.5–50.0)
Hemoglobin: 15.7 g/dL (ref 13.2–17.1)
LYMPHS ABS: 1407 {cells}/uL (ref 850–3900)
MCH: 30.5 pg (ref 27.0–33.0)
MCHC: 35.3 g/dL (ref 32.0–36.0)
MCV: 86.6 fL (ref 80.0–100.0)
MPV: 9.9 fL (ref 7.5–12.5)
Monocytes Relative: 8.6 %
NEUTROS PCT: 53.1 %
Neutro Abs: 2230 cells/uL (ref 1500–7800)
PLATELETS: 295 10*3/uL (ref 140–400)
RBC: 5.14 10*6/uL (ref 4.20–5.80)
RDW: 12.2 % (ref 11.0–15.0)
TOTAL LYMPHOCYTE: 33.5 %
WBC mixed population: 361 cells/uL (ref 200–950)
WBC: 4.2 10*3/uL (ref 3.8–10.8)

## 2017-09-28 LAB — C. TRACHOMATIS/N. GONORRHOEAE RNA
C. trachomatis RNA, TMA: NOT DETECTED
N. gonorrhoeae RNA, TMA: NOT DETECTED

## 2017-09-28 LAB — CK: Total CK: 126 U/L (ref 44–196)

## 2017-09-28 LAB — COMPREHENSIVE METABOLIC PANEL
AG RATIO: 1.7 (calc) (ref 1.0–2.5)
ALBUMIN MSPROF: 4.5 g/dL (ref 3.6–5.1)
ALKALINE PHOSPHATASE (APISO): 78 U/L (ref 40–115)
ALT: 16 U/L (ref 9–46)
AST: 19 U/L (ref 10–40)
BUN: 14 mg/dL (ref 7–25)
CALCIUM: 9.3 mg/dL (ref 8.6–10.3)
CO2: 26 mmol/L (ref 20–32)
Chloride: 105 mmol/L (ref 98–110)
Creat: 1.25 mg/dL (ref 0.60–1.35)
Globulin: 2.7 g/dL (calc) (ref 1.9–3.7)
Glucose, Bld: 95 mg/dL (ref 65–99)
POTASSIUM: 4.3 mmol/L (ref 3.5–5.3)
SODIUM: 141 mmol/L (ref 135–146)
TOTAL PROTEIN: 7.2 g/dL (ref 6.1–8.1)
Total Bilirubin: 0.6 mg/dL (ref 0.2–1.2)

## 2017-09-28 LAB — AMYLASE: Amylase: 48 U/L (ref 21–101)

## 2017-09-28 LAB — FLUORESCENT TREPONEMAL AB(FTA)-IGG-BLD: Fluorescent Treponemal ABS: REACTIVE — AB

## 2017-09-28 LAB — HIV ANTIBODY (ROUTINE TESTING W REFLEX): HIV: NONREACTIVE

## 2017-09-28 LAB — PHOSPHORUS: PHOSPHORUS: 3.1 mg/dL (ref 2.5–4.5)

## 2017-09-28 LAB — RPR: RPR Ser Ql: REACTIVE — AB

## 2017-09-28 LAB — LIPASE: LIPASE: 73 U/L — AB (ref 7–60)

## 2017-09-29 LAB — CT/NG RNA, TMA RECTAL
Chlamydia Trachomatis RNA: NOT DETECTED
Neisseria Gonorrhoeae RNA: NOT DETECTED

## 2017-10-05 ENCOUNTER — Encounter (INDEPENDENT_AMBULATORY_CARE_PROVIDER_SITE_OTHER): Payer: Self-pay

## 2017-10-05 VITALS — BP 123/82 | HR 82 | Temp 98.2°F | Wt 163.0 lb

## 2017-10-05 DIAGNOSIS — Z006 Encounter for examination for normal comparison and control in clinical research program: Secondary | ICD-10-CM

## 2017-10-05 NOTE — Progress Notes (Signed)
Participant here for week 35 for [id number] study. No complaints voiced. HIV rapid non-reactive. Participant scheduled for next study visit on 8/26.

## 2017-10-06 LAB — COMPREHENSIVE METABOLIC PANEL
AG RATIO: 1.8 (calc) (ref 1.0–2.5)
ALBUMIN MSPROF: 4.2 g/dL (ref 3.6–5.1)
ALT: 17 U/L (ref 9–46)
AST: 22 U/L (ref 10–40)
Alkaline phosphatase (APISO): 70 U/L (ref 40–115)
BUN: 16 mg/dL (ref 7–25)
CO2: 27 mmol/L (ref 20–32)
Calcium: 9 mg/dL (ref 8.6–10.3)
Chloride: 105 mmol/L (ref 98–110)
Creat: 1.22 mg/dL (ref 0.60–1.35)
GLOBULIN: 2.4 g/dL (ref 1.9–3.7)
Glucose, Bld: 133 mg/dL — ABNORMAL HIGH (ref 65–99)
Potassium: 3.9 mmol/L (ref 3.5–5.3)
Sodium: 140 mmol/L (ref 135–146)
TOTAL PROTEIN: 6.6 g/dL (ref 6.1–8.1)
Total Bilirubin: 0.4 mg/dL (ref 0.2–1.2)

## 2017-10-06 LAB — CBC WITH DIFFERENTIAL/PLATELET
BASOS ABS: 40 {cells}/uL (ref 0–200)
BASOS PCT: 0.9 %
Eosinophils Absolute: 202 cells/uL (ref 15–500)
Eosinophils Relative: 4.6 %
HCT: 44 % (ref 38.5–50.0)
HEMOGLOBIN: 15.1 g/dL (ref 13.2–17.1)
Lymphs Abs: 1377 cells/uL (ref 850–3900)
MCH: 30.2 pg (ref 27.0–33.0)
MCHC: 34.3 g/dL (ref 32.0–36.0)
MCV: 88 fL (ref 80.0–100.0)
MONOS PCT: 8.9 %
MPV: 9.9 fL (ref 7.5–12.5)
NEUTROS ABS: 2389 {cells}/uL (ref 1500–7800)
Neutrophils Relative %: 54.3 %
Platelets: 303 10*3/uL (ref 140–400)
RBC: 5 10*6/uL (ref 4.20–5.80)
RDW: 12.5 % (ref 11.0–15.0)
TOTAL LYMPHOCYTE: 31.3 %
WBC: 4.4 10*3/uL (ref 3.8–10.8)
WBCMIX: 392 {cells}/uL (ref 200–950)

## 2017-10-06 LAB — AMYLASE: Amylase: 44 U/L (ref 21–101)

## 2017-10-06 LAB — PHOSPHORUS: Phosphorus: 3.6 mg/dL (ref 2.5–4.5)

## 2017-10-06 LAB — HIV ANTIBODY (ROUTINE TESTING W REFLEX): HIV 1&2 Ab, 4th Generation: NONREACTIVE

## 2017-10-06 LAB — CK: Total CK: 219 U/L — ABNORMAL HIGH (ref 44–196)

## 2017-10-06 LAB — LIPASE: Lipase: 63 U/L — ABNORMAL HIGH (ref 7–60)

## 2017-11-15 ENCOUNTER — Encounter: Payer: Self-pay | Admitting: *Deleted

## 2017-11-18 ENCOUNTER — Encounter: Payer: Self-pay | Admitting: *Deleted

## 2017-11-19 ENCOUNTER — Encounter (INDEPENDENT_AMBULATORY_CARE_PROVIDER_SITE_OTHER): Payer: Self-pay | Admitting: *Deleted

## 2017-11-19 VITALS — BP 120/85 | HR 76 | Temp 97.8°F | Wt 159.0 lb

## 2017-11-19 DIAGNOSIS — Z006 Encounter for examination for normal comparison and control in clinical research program: Secondary | ICD-10-CM

## 2017-11-19 NOTE — Progress Notes (Signed)
Erik Cain is here for his week 41 injection visit for HPTN 083. He says his life has been very hectic the past month. 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 His parents have also had some health issues he has been dealing with. He has also noticed that he has been more depressed than usual and not quite sure why. We discussed seeing a counselor or at least going to his physician to discuss, which he says he doesn't have time for. He did get married over July 4th. He said he took his first month's study meds and then couldn't find the other bottles until yesterday when his husband found them in a box. He will resume them today. AN injection of study med was given without problem. He will return in 10 days for followup.

## 2017-11-21 LAB — COMPREHENSIVE METABOLIC PANEL
AG Ratio: 1.8 (calc) (ref 1.0–2.5)
ALT: 17 U/L (ref 9–46)
AST: 23 U/L (ref 10–40)
Albumin: 4.4 g/dL (ref 3.6–5.1)
Alkaline phosphatase (APISO): 70 U/L (ref 40–115)
BUN / CREAT RATIO: 14 (calc) (ref 6–22)
BUN: 20 mg/dL (ref 7–25)
CO2: 28 mmol/L (ref 20–32)
CREATININE: 1.38 mg/dL — AB (ref 0.60–1.35)
Calcium: 9.2 mg/dL (ref 8.6–10.3)
Chloride: 106 mmol/L (ref 98–110)
GLUCOSE: 76 mg/dL (ref 65–99)
Globulin: 2.5 g/dL (calc) (ref 1.9–3.7)
Potassium: 4.6 mmol/L (ref 3.5–5.3)
Sodium: 140 mmol/L (ref 135–146)
Total Bilirubin: 0.6 mg/dL (ref 0.2–1.2)
Total Protein: 6.9 g/dL (ref 6.1–8.1)

## 2017-11-21 LAB — CBC WITH DIFFERENTIAL/PLATELET
BASOS ABS: 49 {cells}/uL (ref 0–200)
Basophils Relative: 1.2 %
EOS ABS: 131 {cells}/uL (ref 15–500)
Eosinophils Relative: 3.2 %
HCT: 47.6 % (ref 38.5–50.0)
HEMOGLOBIN: 16.3 g/dL (ref 13.2–17.1)
Lymphs Abs: 1423 cells/uL (ref 850–3900)
MCH: 29.8 pg (ref 27.0–33.0)
MCHC: 34.2 g/dL (ref 32.0–36.0)
MCV: 87 fL (ref 80.0–100.0)
MONOS PCT: 8.1 %
MPV: 9.8 fL (ref 7.5–12.5)
NEUTROS ABS: 2165 {cells}/uL (ref 1500–7800)
Neutrophils Relative %: 52.8 %
Platelets: 335 10*3/uL (ref 140–400)
RBC: 5.47 10*6/uL (ref 4.20–5.80)
RDW: 12.1 % (ref 11.0–15.0)
Total Lymphocyte: 34.7 %
WBC mixed population: 332 cells/uL (ref 200–950)
WBC: 4.1 10*3/uL (ref 3.8–10.8)

## 2017-11-21 LAB — CK: CK TOTAL: 422 U/L — AB (ref 44–196)

## 2017-11-21 LAB — LIPASE: LIPASE: 71 U/L — AB (ref 7–60)

## 2017-11-21 LAB — PHOSPHORUS: Phosphorus: 3.5 mg/dL (ref 2.5–4.5)

## 2017-11-21 LAB — AMYLASE: Amylase: 45 U/L (ref 21–101)

## 2017-11-21 LAB — HIV ANTIBODY (ROUTINE TESTING W REFLEX): HIV 1&2 Ab, 4th Generation: NONREACTIVE

## 2017-11-29 ENCOUNTER — Encounter (INDEPENDENT_AMBULATORY_CARE_PROVIDER_SITE_OTHER): Payer: Self-pay

## 2017-11-29 VITALS — BP 126/89 | HR 85 | Temp 98.1°F

## 2017-11-29 DIAGNOSIS — Z006 Encounter for examination for normal comparison and control in clinical research program: Secondary | ICD-10-CM

## 2017-11-29 NOTE — Progress Notes (Signed)
Participant here for [id number] study visit. States he had no issues with last injection. Rapid HIV non-reactive. Scheduled next study visit on 10/21.

## 2017-11-30 LAB — CBC WITH DIFFERENTIAL/PLATELET
BASOS PCT: 0.7 %
Basophils Absolute: 50 cells/uL (ref 0–200)
EOS ABS: 128 {cells}/uL (ref 15–500)
Eosinophils Relative: 1.8 %
HCT: 46.2 % (ref 38.5–50.0)
Hemoglobin: 15.9 g/dL (ref 13.2–17.1)
Lymphs Abs: 1271 cells/uL (ref 850–3900)
MCH: 29.4 pg (ref 27.0–33.0)
MCHC: 34.4 g/dL (ref 32.0–36.0)
MCV: 85.4 fL (ref 80.0–100.0)
MPV: 10.1 fL (ref 7.5–12.5)
Monocytes Relative: 5.8 %
NEUTROS PCT: 73.8 %
Neutro Abs: 5240 cells/uL (ref 1500–7800)
PLATELETS: 286 10*3/uL (ref 140–400)
RBC: 5.41 10*6/uL (ref 4.20–5.80)
RDW: 12.1 % (ref 11.0–15.0)
TOTAL LYMPHOCYTE: 17.9 %
WBC: 7.1 10*3/uL (ref 3.8–10.8)
WBCMIX: 412 {cells}/uL (ref 200–950)

## 2017-11-30 LAB — COMPREHENSIVE METABOLIC PANEL
AG Ratio: 1.6 (calc) (ref 1.0–2.5)
ALT: 25 U/L (ref 9–46)
AST: 24 U/L (ref 10–40)
Albumin: 4.7 g/dL (ref 3.6–5.1)
Alkaline phosphatase (APISO): 63 U/L (ref 40–115)
BUN: 18 mg/dL (ref 7–25)
CO2: 27 mmol/L (ref 20–32)
CREATININE: 1.26 mg/dL (ref 0.60–1.35)
Calcium: 9.5 mg/dL (ref 8.6–10.3)
Chloride: 101 mmol/L (ref 98–110)
GLOBULIN: 2.9 g/dL (ref 1.9–3.7)
Glucose, Bld: 104 mg/dL — ABNORMAL HIGH (ref 65–99)
Potassium: 4.4 mmol/L (ref 3.5–5.3)
Sodium: 137 mmol/L (ref 135–146)
Total Bilirubin: 0.7 mg/dL (ref 0.2–1.2)
Total Protein: 7.6 g/dL (ref 6.1–8.1)

## 2017-11-30 LAB — LIPASE: Lipase: 44 U/L (ref 7–60)

## 2017-11-30 LAB — PHOSPHORUS: PHOSPHORUS: 3.9 mg/dL (ref 2.5–4.5)

## 2017-11-30 LAB — AMYLASE: AMYLASE: 41 U/L (ref 21–101)

## 2017-11-30 LAB — CK: CK TOTAL: 153 U/L (ref 44–196)

## 2017-11-30 LAB — HIV ANTIBODY (ROUTINE TESTING W REFLEX): HIV 1&2 Ab, 4th Generation: NONREACTIVE

## 2018-01-10 VITALS — BP 124/84 | HR 69 | Temp 97.9°F | Wt 162.5 lb

## 2018-01-10 DIAGNOSIS — Z006 Encounter for examination for normal comparison and control in clinical research program: Secondary | ICD-10-CM

## 2018-01-10 NOTE — Research (Signed)
Participant here for [id number] study visit. No complaints or concerns voiced. Patient is 100% compliant with oral IP. Today's rapid HIV non-reactive. Participant received new dispense of oral IP and IM injection of cabotegravir/placebo in the right glute without issues. Participant is scheduled for next visit on 01/24/18.

## 2018-01-11 LAB — CBC WITH DIFFERENTIAL/PLATELET
BASOS PCT: 1 %
Basophils Absolute: 40 cells/uL (ref 0–200)
EOS PCT: 4 %
Eosinophils Absolute: 160 cells/uL (ref 15–500)
HEMATOCRIT: 48.2 % (ref 38.5–50.0)
HEMOGLOBIN: 16.6 g/dL (ref 13.2–17.1)
LYMPHS ABS: 1504 {cells}/uL (ref 850–3900)
MCH: 29.7 pg (ref 27.0–33.0)
MCHC: 34.4 g/dL (ref 32.0–36.0)
MCV: 86.2 fL (ref 80.0–100.0)
MPV: 10.2 fL (ref 7.5–12.5)
Monocytes Relative: 8.2 %
Neutro Abs: 1968 cells/uL (ref 1500–7800)
Neutrophils Relative %: 49.2 %
Platelets: 310 10*3/uL (ref 140–400)
RBC: 5.59 10*6/uL (ref 4.20–5.80)
RDW: 13 % (ref 11.0–15.0)
Total Lymphocyte: 37.6 %
WBC mixed population: 328 cells/uL (ref 200–950)
WBC: 4 10*3/uL (ref 3.8–10.8)

## 2018-01-11 LAB — LIPASE: Lipase: 66 U/L — ABNORMAL HIGH (ref 7–60)

## 2018-01-11 LAB — COMPREHENSIVE METABOLIC PANEL
AG RATIO: 1.7 (calc) (ref 1.0–2.5)
ALBUMIN MSPROF: 4.6 g/dL (ref 3.6–5.1)
ALKALINE PHOSPHATASE (APISO): 72 U/L (ref 40–115)
ALT: 20 U/L (ref 9–46)
AST: 20 U/L (ref 10–40)
BUN: 13 mg/dL (ref 7–25)
CHLORIDE: 101 mmol/L (ref 98–110)
CO2: 29 mmol/L (ref 20–32)
CREATININE: 1.3 mg/dL (ref 0.60–1.35)
Calcium: 9.2 mg/dL (ref 8.6–10.3)
GLOBULIN: 2.7 g/dL (ref 1.9–3.7)
GLUCOSE: 98 mg/dL (ref 65–99)
POTASSIUM: 4.6 mmol/L (ref 3.5–5.3)
Sodium: 138 mmol/L (ref 135–146)
Total Bilirubin: 0.5 mg/dL (ref 0.2–1.2)
Total Protein: 7.3 g/dL (ref 6.1–8.1)

## 2018-01-11 LAB — AMYLASE: Amylase: 49 U/L (ref 21–101)

## 2018-01-11 LAB — HIV ANTIBODY (ROUTINE TESTING W REFLEX): HIV: NONREACTIVE

## 2018-01-11 LAB — CK: CK TOTAL: 86 U/L (ref 44–196)

## 2018-01-11 LAB — PHOSPHORUS: PHOSPHORUS: 3.9 mg/dL (ref 2.5–4.5)

## 2018-01-25 ENCOUNTER — Encounter (INDEPENDENT_AMBULATORY_CARE_PROVIDER_SITE_OTHER): Payer: Self-pay

## 2018-01-25 VITALS — BP 120/87 | HR 79 | Temp 97.6°F | Wt 163.2 lb

## 2018-01-25 DIAGNOSIS — Z006 Encounter for examination for normal comparison and control in clinical research program: Secondary | ICD-10-CM

## 2018-01-25 NOTE — Research (Signed)
Participant here for [id number] study visit for safety follow-up. Today's rapid HIV non-reactive. No new concerns voiced, vital signs within normal limits. Participant voices no issues with last injection. He is scheduled for next study visit on 12/16.

## 2018-01-26 LAB — COMPREHENSIVE METABOLIC PANEL
AG RATIO: 1.7 (calc) (ref 1.0–2.5)
ALBUMIN MSPROF: 4.4 g/dL (ref 3.6–5.1)
ALT: 19 U/L (ref 9–46)
AST: 19 U/L (ref 10–40)
Alkaline phosphatase (APISO): 63 U/L (ref 40–115)
BILIRUBIN TOTAL: 0.4 mg/dL (ref 0.2–1.2)
BUN: 14 mg/dL (ref 7–25)
CALCIUM: 9.1 mg/dL (ref 8.6–10.3)
CO2: 27 mmol/L (ref 20–32)
Chloride: 101 mmol/L (ref 98–110)
Creat: 1.24 mg/dL (ref 0.60–1.35)
Globulin: 2.6 g/dL (calc) (ref 1.9–3.7)
Glucose, Bld: 103 mg/dL — ABNORMAL HIGH (ref 65–99)
POTASSIUM: 3.8 mmol/L (ref 3.5–5.3)
SODIUM: 137 mmol/L (ref 135–146)
TOTAL PROTEIN: 7 g/dL (ref 6.1–8.1)

## 2018-01-26 LAB — CBC WITH DIFFERENTIAL/PLATELET
BASOS PCT: 0.9 %
Basophils Absolute: 42 cells/uL (ref 0–200)
Eosinophils Absolute: 89 cells/uL (ref 15–500)
Eosinophils Relative: 1.9 %
HCT: 47.2 % (ref 38.5–50.0)
Hemoglobin: 16.2 g/dL (ref 13.2–17.1)
Lymphs Abs: 1673 cells/uL (ref 850–3900)
MCH: 30.1 pg (ref 27.0–33.0)
MCHC: 34.3 g/dL (ref 32.0–36.0)
MCV: 87.7 fL (ref 80.0–100.0)
MONOS PCT: 9 %
MPV: 9.9 fL (ref 7.5–12.5)
NEUTROS PCT: 52.6 %
Neutro Abs: 2472 cells/uL (ref 1500–7800)
PLATELETS: 311 10*3/uL (ref 140–400)
RBC: 5.38 10*6/uL (ref 4.20–5.80)
RDW: 13.2 % (ref 11.0–15.0)
TOTAL LYMPHOCYTE: 35.6 %
WBC mixed population: 423 cells/uL (ref 200–950)
WBC: 4.7 10*3/uL (ref 3.8–10.8)

## 2018-01-26 LAB — LIPASE: Lipase: 59 U/L (ref 7–60)

## 2018-01-26 LAB — HIV ANTIBODY (ROUTINE TESTING W REFLEX): HIV 1&2 Ab, 4th Generation: NONREACTIVE

## 2018-01-26 LAB — AMYLASE: Amylase: 48 U/L (ref 21–101)

## 2018-01-26 LAB — CK: Total CK: 90 U/L (ref 44–196)

## 2018-01-26 LAB — PHOSPHORUS: Phosphorus: 4.2 mg/dL (ref 2.5–4.5)

## 2018-03-09 ENCOUNTER — Encounter: Payer: Self-pay | Admitting: *Deleted

## 2018-03-11 ENCOUNTER — Encounter (INDEPENDENT_AMBULATORY_CARE_PROVIDER_SITE_OTHER): Payer: Self-pay | Admitting: *Deleted

## 2018-03-11 VITALS — BP 138/95 | HR 84 | Temp 98.0°F | Wt 164.5 lb

## 2018-03-11 DIAGNOSIS — A749 Chlamydial infection, unspecified: Secondary | ICD-10-CM

## 2018-03-11 DIAGNOSIS — Z006 Encounter for examination for normal comparison and control in clinical research program: Secondary | ICD-10-CM

## 2018-03-11 LAB — URINALYSIS, ROUTINE W REFLEX MICROSCOPIC
BILIRUBIN URINE: NEGATIVE
Glucose, UA: NEGATIVE
Hgb urine dipstick: NEGATIVE
KETONES UR: NEGATIVE
Leukocytes, UA: NEGATIVE
Nitrite: NEGATIVE
Protein, ur: NEGATIVE
Specific Gravity, Urine: 1.022 (ref 1.001–1.03)
pH: <=5 (ref 5.0–8.0)

## 2018-03-11 MED ORDER — DOXYCYCLINE HYCLATE 100 MG PO CAPS: 100.0000 mg | ORAL_CAPSULE | Freq: Two times a day (BID) | ORAL | 0 refills | Status: DC

## 2018-03-11 MED FILL — DOXYCYCLINE HYC 100 MG CAPS: 100 | 7 days supply | Qty: 14 | Fill #0

## 2018-03-11 NOTE — Research (Addendum)
Erik Cain is here for his week 57 HPTN 083 visit. He says that his partner was recently tested and treated for chlamydia at the [facility]. STI testing obtained today. Per Dr. Daiva EvesVan Dam called in doxycycline 100mg  BID for 7days. Adherence 100% with his oral study medication. Rapid HIV non-reactive. Cabotegravir/placebo injection given (R) glute without problem. He will return in 2 weeks for his next study visit.

## 2018-03-12 LAB — C. TRACHOMATIS/N. GONORRHOEAE RNA
C. trachomatis RNA, TMA: NOT DETECTED
N. gonorrhoeae RNA, TMA: NOT DETECTED

## 2018-03-14 LAB — COMPREHENSIVE METABOLIC PANEL
AG Ratio: 1.6 (calc) (ref 1.0–2.5)
ALT: 31 U/L (ref 9–46)
AST: 25 U/L (ref 10–40)
Albumin: 4.5 g/dL (ref 3.6–5.1)
Alkaline phosphatase (APISO): 76 U/L (ref 40–115)
BILIRUBIN TOTAL: 0.6 mg/dL (ref 0.2–1.2)
BUN: 16 mg/dL (ref 7–25)
CO2: 29 mmol/L (ref 20–32)
Calcium: 9.7 mg/dL (ref 8.6–10.3)
Chloride: 104 mmol/L (ref 98–110)
Creat: 1.26 mg/dL (ref 0.60–1.35)
Globulin: 2.9 g/dL (calc) (ref 1.9–3.7)
Glucose, Bld: 102 mg/dL — ABNORMAL HIGH (ref 65–99)
POTASSIUM: 4.3 mmol/L (ref 3.5–5.3)
Sodium: 140 mmol/L (ref 135–146)
Total Protein: 7.4 g/dL (ref 6.1–8.1)

## 2018-03-14 LAB — LIPID PANEL
Cholesterol: 176 mg/dL (ref <200)
HDL: 52 mg/dL (ref >40)
LDL Cholesterol (Calc): 107 mg/dL (calc) — ABNORMAL HIGH
Non-HDL Cholesterol (Calc): 124 mg/dL (calc) (ref <130)
TRIGLYCERIDES: 76 mg/dL (ref <150)
Total CHOL/HDL Ratio: 3.4 (calc) (ref <5.0)

## 2018-03-14 LAB — HEPATITIS C ANTIBODY
Hepatitis C Ab: NONREACTIVE
SIGNAL TO CUT-OFF: 0.01 (ref <1.00)

## 2018-03-14 LAB — CBC WITH DIFFERENTIAL/PLATELET
Absolute Monocytes: 299 cells/uL (ref 200–950)
BASOS ABS: 31 {cells}/uL (ref 0–200)
Basophils Relative: 0.7 %
EOS PCT: 2.3 %
Eosinophils Absolute: 101 cells/uL (ref 15–500)
HCT: 48.6 % (ref 38.5–50.0)
HEMOGLOBIN: 16.7 g/dL (ref 13.2–17.1)
Lymphs Abs: 1030 cells/uL (ref 850–3900)
MCH: 29.9 pg (ref 27.0–33.0)
MCHC: 34.4 g/dL (ref 32.0–36.0)
MCV: 87.1 fL (ref 80.0–100.0)
MONOS PCT: 6.8 %
MPV: 9.8 fL (ref 7.5–12.5)
NEUTROS ABS: 2939 {cells}/uL (ref 1500–7800)
Neutrophils Relative %: 66.8 %
PLATELETS: 312 10*3/uL (ref 140–400)
RBC: 5.58 10*6/uL (ref 4.20–5.80)
RDW: 12.6 % (ref 11.0–15.0)
TOTAL LYMPHOCYTE: 23.4 %
WBC: 4.4 10*3/uL (ref 3.8–10.8)

## 2018-03-14 LAB — RPR: RPR: REACTIVE — AB

## 2018-03-14 LAB — AMYLASE: Amylase: 44 U/L (ref 21–101)

## 2018-03-14 LAB — RPR TITER: RPR Titer: 1:2 {titer} — ABNORMAL HIGH

## 2018-03-14 LAB — HIV ANTIBODY (ROUTINE TESTING W REFLEX): HIV 1&2 Ab, 4th Generation: NONREACTIVE

## 2018-03-14 LAB — PHOSPHORUS: Phosphorus: 2.9 mg/dL (ref 2.5–4.5)

## 2018-03-14 LAB — LIPASE: Lipase: 55 U/L (ref 7–60)

## 2018-03-14 LAB — FLUORESCENT TREPONEMAL AB(FTA)-IGG-BLD: Fluorescent Treponemal ABS: REACTIVE — AB

## 2018-03-14 LAB — CK: Total CK: 90 U/L (ref 44–196)

## 2018-03-15 LAB — CT/NG RNA, TMA RECTAL
Chlamydia Trachomatis RNA: NOT DETECTED
Neisseria Gonorrhoeae RNA: NOT DETECTED

## 2018-03-24 ENCOUNTER — Encounter (INDEPENDENT_AMBULATORY_CARE_PROVIDER_SITE_OTHER): Payer: Self-pay | Admitting: *Deleted

## 2018-03-24 VITALS — BP 140/97 | HR 80 | Temp 97.8°F | Wt 164.5 lb

## 2018-03-24 DIAGNOSIS — Z006 Encounter for examination for normal comparison and control in clinical research program: Secondary | ICD-10-CM

## 2018-03-24 NOTE — Research (Signed)
Erik Cain is here for his week 59 safety visit for [id number]. He denies any new problems or concerns, no ISR, no adherence issues. He has been staying with his mother while his father has been in rehab for a broken hip and needs to make plans for their care in the future, so that has been stressing him out some. He Erik Cain be returning in February for the next visit.

## 2018-03-25 LAB — COMPLETE METABOLIC PANEL WITH GFR
AG RATIO: 1.7 (calc) (ref 1.0–2.5)
ALT: 23 U/L (ref 9–46)
AST: 25 U/L (ref 10–40)
Albumin: 4.7 g/dL (ref 3.6–5.1)
Alkaline phosphatase (APISO): 74 U/L (ref 40–115)
BUN: 20 mg/dL (ref 7–25)
CO2: 28 mmol/L (ref 20–32)
Calcium: 9.3 mg/dL (ref 8.6–10.3)
Chloride: 100 mmol/L (ref 98–110)
Creat: 1.1 mg/dL (ref 0.60–1.35)
GFR, Est African American: 93 mL/min/{1.73_m2} (ref >=60)
GFR, Est Non African American: 80 mL/min/{1.73_m2} (ref >=60)
Globulin: 2.8 g/dL (calc) (ref 1.9–3.7)
Glucose, Bld: 97 mg/dL (ref 65–99)
Potassium: 4.5 mmol/L (ref 3.5–5.3)
Sodium: 136 mmol/L (ref 135–146)
Total Bilirubin: 0.9 mg/dL (ref 0.2–1.2)
Total Protein: 7.5 g/dL (ref 6.1–8.1)

## 2018-03-25 LAB — CBC WITH DIFFERENTIAL/PLATELET
ABSOLUTE MONOCYTES: 296 {cells}/uL (ref 200–950)
Basophils Absolute: 31 cells/uL (ref 0–200)
Basophils Relative: 0.9 %
Eosinophils Absolute: 99 cells/uL (ref 15–500)
Eosinophils Relative: 2.9 %
HCT: 48.7 % (ref 38.5–50.0)
Hemoglobin: 17 g/dL (ref 13.2–17.1)
Lymphs Abs: 1258 cells/uL (ref 850–3900)
MCH: 30.1 pg (ref 27.0–33.0)
MCHC: 34.9 g/dL (ref 32.0–36.0)
MCV: 86.3 fL (ref 80.0–100.0)
MPV: 9.8 fL (ref 7.5–12.5)
Monocytes Relative: 8.7 %
Neutro Abs: 1717 cells/uL (ref 1500–7800)
Neutrophils Relative %: 50.5 %
Platelets: 319 10*3/uL (ref 140–400)
RBC: 5.64 10*6/uL (ref 4.20–5.80)
RDW: 12.3 % (ref 11.0–15.0)
Total Lymphocyte: 37 %
WBC: 3.4 10*3/uL — AB (ref 3.8–10.8)

## 2018-03-25 LAB — PHOSPHORUS: Phosphorus: 3.5 mg/dL (ref 2.5–4.5)

## 2018-03-25 LAB — LIPASE: Lipase: 55 U/L (ref 7–60)

## 2018-03-25 LAB — AMYLASE: Amylase: 40 U/L (ref 21–101)

## 2018-03-25 LAB — CK: Total CK: 137 U/L (ref 44–196)

## 2018-03-25 LAB — HIV ANTIBODY (ROUTINE TESTING W REFLEX): HIV 1&2 Ab, 4th Generation: NONREACTIVE

## 2018-05-12 ENCOUNTER — Encounter: Payer: Self-pay | Admitting: *Deleted

## 2018-05-19 ENCOUNTER — Encounter: Payer: Self-pay | Admitting: *Deleted

## 2018-05-24 ENCOUNTER — Encounter (INDEPENDENT_AMBULATORY_CARE_PROVIDER_SITE_OTHER): Payer: Self-pay

## 2018-05-24 VITALS — Wt 169.5 lb

## 2018-05-24 DIAGNOSIS — Z006 Encounter for examination for normal comparison and control in clinical research program: Secondary | ICD-10-CM

## 2018-05-25 LAB — COMPREHENSIVE METABOLIC PANEL
AG Ratio: 1.7 (calc) (ref 1.0–2.5)
ALT: 19 U/L (ref 9–46)
AST: 20 U/L (ref 10–40)
Albumin: 4.5 g/dL (ref 3.6–5.1)
Alkaline phosphatase (APISO): 56 U/L (ref 36–130)
BUN: 15 mg/dL (ref 7–25)
CHLORIDE: 104 mmol/L (ref 98–110)
CO2: 24 mmol/L (ref 20–32)
Calcium: 8.9 mg/dL (ref 8.6–10.3)
Creat: 1.11 mg/dL (ref 0.60–1.35)
GLOBULIN: 2.6 g/dL (ref 1.9–3.7)
Glucose, Bld: 124 mg/dL — ABNORMAL HIGH (ref 65–99)
POTASSIUM: 3.9 mmol/L (ref 3.5–5.3)
Sodium: 137 mmol/L (ref 135–146)
Total Bilirubin: 0.8 mg/dL (ref 0.2–1.2)
Total Protein: 7.1 g/dL (ref 6.1–8.1)

## 2018-05-25 LAB — CBC WITH DIFFERENTIAL/PLATELET
Absolute Monocytes: 321 cells/uL (ref 200–950)
Basophils Absolute: 31 cells/uL (ref 0–200)
Basophils Relative: 0.7 %
Eosinophils Absolute: 101 cells/uL (ref 15–500)
Eosinophils Relative: 2.3 %
HEMATOCRIT: 44.6 % (ref 38.5–50.0)
HEMOGLOBIN: 15.8 g/dL (ref 13.2–17.1)
Lymphs Abs: 1162 cells/uL (ref 850–3900)
MCH: 30.4 pg (ref 27.0–33.0)
MCHC: 35.4 g/dL (ref 32.0–36.0)
MCV: 85.9 fL (ref 80.0–100.0)
MPV: 10 fL (ref 7.5–12.5)
Monocytes Relative: 7.3 %
NEUTROS ABS: 2785 {cells}/uL (ref 1500–7800)
Neutrophils Relative %: 63.3 %
Platelets: 307 10*3/uL (ref 140–400)
RBC: 5.19 10*6/uL (ref 4.20–5.80)
RDW: 11.9 % (ref 11.0–15.0)
Total Lymphocyte: 26.4 %
WBC: 4.4 10*3/uL (ref 3.8–10.8)

## 2018-05-25 LAB — PHOSPHORUS: Phosphorus: 2.6 mg/dL (ref 2.5–4.5)

## 2018-05-25 LAB — AMYLASE: Amylase: 49 U/L (ref 21–101)

## 2018-05-25 LAB — HIV ANTIBODY (ROUTINE TESTING W REFLEX): HIV 1&2 Ab, 4th Generation: NONREACTIVE

## 2018-05-25 LAB — LIPASE: Lipase: 79 U/L — ABNORMAL HIGH (ref 7–60)

## 2018-05-25 LAB — CK: CK TOTAL: 129 U/L (ref 44–196)

## 2018-05-26 NOTE — Research (Signed)
Participant here for [id number] study. He has no new health concerns but is not coping well with the loss of his father in December and the responsibilities of caring for his mother and his busy work schedule. We discussed the need for him to be able to grieve and to realize that burnout may occur when he is overstressed with being a caregiver accompanied with this loss and work. Today his CES-D10 score is elevated at 22 and while this is situational he does acknowledge it would be beneficial for him to speak to someone. I helped him schedule an appointment with Rene Kocher, counselor for this week on 3/6. Otherwise he is well and has no other concerns. He states that during this time he did not take his medication and therefore compliance is only 42%. We discussed that he should take this time to reinvest in caring for himself, including protecting himself by taking his PREP and that he will be unable to adequate care for others if he is not taking good care of himself. He acknowledged that he knew he needed to get back on his daily routine and understood the importance of adherence to the daily medication. Today's rapid HIV non-reactive. He received new dispense of oral IP and IM injection of cabotegravir/placebo in right glute. He is scheduled for follow up on 05/31/18.

## 2018-05-27 ENCOUNTER — Ambulatory Visit: Payer: Self-pay | Admitting: Licensed Clinical Social Worker

## 2018-05-31 ENCOUNTER — Encounter (INDEPENDENT_AMBULATORY_CARE_PROVIDER_SITE_OTHER): Payer: Self-pay

## 2018-05-31 VITALS — BP 121/84 | HR 79 | Temp 98.3°F | Wt 168.8 lb

## 2018-05-31 DIAGNOSIS — Z006 Encounter for examination for normal comparison and control in clinical research program: Secondary | ICD-10-CM

## 2018-05-31 NOTE — Research (Signed)
Participant here for study [id number]. He denies any issues with his last injection. He was unable to go see Rene Kocher due to his work demands and last Friday he was not feeling up to going. He is aware he can call or text at any time to reschedule an appointment with her. [facility] ceftriaxone 250mg  IM and azithromycin 1g oral as his partner was diagnosed with gonorrhea. He has no new concerns or complaints. He is scheduled for next study visit on 06/28/18.

## 2018-06-01 LAB — COMPREHENSIVE METABOLIC PANEL
AG Ratio: 1.6 (calc) (ref 1.0–2.5)
ALT: 25 U/L (ref 9–46)
AST: 23 U/L (ref 10–40)
Albumin: 4.3 g/dL (ref 3.6–5.1)
Alkaline phosphatase (APISO): 58 U/L (ref 36–130)
BUN: 15 mg/dL (ref 7–25)
CO2: 27 mmol/L (ref 20–32)
Calcium: 9 mg/dL (ref 8.6–10.3)
Chloride: 103 mmol/L (ref 98–110)
Creat: 1.11 mg/dL (ref 0.60–1.35)
GLUCOSE: 102 mg/dL — AB (ref 65–99)
Globulin: 2.7 g/dL (calc) (ref 1.9–3.7)
Potassium: 4.2 mmol/L (ref 3.5–5.3)
Sodium: 137 mmol/L (ref 135–146)
Total Bilirubin: 0.5 mg/dL (ref 0.2–1.2)
Total Protein: 7 g/dL (ref 6.1–8.1)

## 2018-06-01 LAB — CBC WITH DIFFERENTIAL/PLATELET
Absolute Monocytes: 298 cells/uL (ref 200–950)
Basophils Absolute: 39 cells/uL (ref 0–200)
Basophils Relative: 1.1 %
Eosinophils Absolute: 109 cells/uL (ref 15–500)
Eosinophils Relative: 3.1 %
HCT: 45.3 % (ref 38.5–50.0)
Hemoglobin: 15.8 g/dL (ref 13.2–17.1)
Lymphs Abs: 1299 cells/uL (ref 850–3900)
MCH: 30 pg (ref 27.0–33.0)
MCHC: 34.9 g/dL (ref 32.0–36.0)
MCV: 86 fL (ref 80.0–100.0)
MONOS PCT: 8.5 %
MPV: 9.9 fL (ref 7.5–12.5)
NEUTROS PCT: 50.2 %
Neutro Abs: 1757 cells/uL (ref 1500–7800)
Platelets: 312 10*3/uL (ref 140–400)
RBC: 5.27 10*6/uL (ref 4.20–5.80)
RDW: 12.1 % (ref 11.0–15.0)
Total Lymphocyte: 37.1 %
WBC: 3.5 10*3/uL — ABNORMAL LOW (ref 3.8–10.8)

## 2018-06-01 LAB — PHOSPHORUS: Phosphorus: 3.6 mg/dL (ref 2.5–4.5)

## 2018-06-01 LAB — HIV ANTIBODY (ROUTINE TESTING W REFLEX): HIV 1&2 Ab, 4th Generation: NONREACTIVE

## 2018-06-01 LAB — AMYLASE: Amylase: 43 U/L (ref 21–101)

## 2018-06-01 LAB — LIPASE: Lipase: 59 U/L (ref 7–60)

## 2018-06-01 LAB — CK: Total CK: 117 U/L (ref 44–196)

## 2018-07-05 ENCOUNTER — Other Ambulatory Visit: Payer: Self-pay

## 2018-07-05 ENCOUNTER — Encounter (INDEPENDENT_AMBULATORY_CARE_PROVIDER_SITE_OTHER): Payer: Self-pay | Admitting: *Deleted

## 2018-07-05 VITALS — BP 130/92 | HR 86 | Temp 97.8°F | Wt 170.5 lb

## 2018-07-05 DIAGNOSIS — Z006 Encounter for examination for normal comparison and control in clinical research program: Secondary | ICD-10-CM

## 2018-07-05 LAB — COMPLETE METABOLIC PANEL WITH GFR
AG Ratio: 1.5 (calc) (ref 1.0–2.5)
ALT: 33 U/L (ref 9–46)
AST: 27 U/L (ref 10–40)
Albumin: 4.4 g/dL (ref 3.6–5.1)
Alkaline phosphatase (APISO): 64 U/L (ref 36–130)
BUN: 16 mg/dL (ref 7–25)
CO2: 29 mmol/L (ref 20–32)
Calcium: 9.2 mg/dL (ref 8.6–10.3)
Chloride: 102 mmol/L (ref 98–110)
Creat: 1.03 mg/dL (ref 0.60–1.35)
GFR, Est African American: 100 mL/min/{1.73_m2} (ref >=60)
GFR, Est Non African American: 86 mL/min/{1.73_m2} (ref >=60)
Globulin: 3 g/dL (calc) (ref 1.9–3.7)
Glucose, Bld: 86 mg/dL (ref 65–99)
Potassium: 4.4 mmol/L (ref 3.5–5.3)
Sodium: 138 mmol/L (ref 135–146)
Total Bilirubin: 0.6 mg/dL (ref 0.2–1.2)
Total Protein: 7.4 g/dL (ref 6.1–8.1)

## 2018-07-05 MED ORDER — STUDY - HPTN 083 - EMTRICITABINE/TENOFOVIR DISOPROXIL FUMARATE 200-300MG (TRUVADA) OR PLACEBO TABLET (PI-VAN DAM): 1.0000 | ORAL_TABLET | Freq: Every day | ORAL | Status: DC

## 2018-07-05 MED ORDER — STUDY - HPTN 083 - CABOTEGRAVIR 600MG/3ML INJECTION OR PLACEBO (PI-VAN DAM): 600.0000 mg | INJECTION | INTRAMUSCULAR | 0 refills | Status: DC

## 2018-07-05 NOTE — Research (Signed)
Erik Cain was here for his week 73 injection visit for [facility] 083. He c/o heartburn and has been taking prilosec otc for relief. He also has noticed some seasonal allergies and takes benadryl prn. An injection of study product was given in his rt glut. Without problem. He will be returning next week for followup.

## 2018-07-06 LAB — CBC WITH DIFFERENTIAL/PLATELET
Absolute Monocytes: 300 cells/uL (ref 200–950)
Basophils Absolute: 31 cells/uL (ref 0–200)
Basophils Relative: 0.8 %
Eosinophils Absolute: 121 cells/uL (ref 15–500)
Eosinophils Relative: 3.1 %
HCT: 49.4 % (ref 38.5–50.0)
Hemoglobin: 16.7 g/dL (ref 13.2–17.1)
Lymphs Abs: 1424 cells/uL (ref 850–3900)
MCH: 29.5 pg (ref 27.0–33.0)
MCHC: 33.8 g/dL (ref 32.0–36.0)
MCV: 87.3 fL (ref 80.0–100.0)
MPV: 9.9 fL (ref 7.5–12.5)
Monocytes Relative: 7.7 %
Neutro Abs: 2024 cells/uL (ref 1500–7800)
Neutrophils Relative %: 51.9 %
Platelets: 304 10*3/uL (ref 140–400)
RBC: 5.66 10*6/uL (ref 4.20–5.80)
RDW: 12.7 % (ref 11.0–15.0)
Total Lymphocyte: 36.5 %
WBC: 3.9 10*3/uL (ref 3.8–10.8)

## 2018-07-06 LAB — PHOSPHORUS: Phosphorus: 3 mg/dL (ref 2.5–4.5)

## 2018-07-06 LAB — LIPASE: Lipase: 61 U/L — ABNORMAL HIGH (ref 7–60)

## 2018-07-06 LAB — HIV ANTIBODY (ROUTINE TESTING W REFLEX): HIV 1&2 Ab, 4th Generation: NONREACTIVE

## 2018-07-06 LAB — CK: Total CK: 99 U/L (ref 44–196)

## 2018-07-06 LAB — AMYLASE: Amylase: 49 U/L (ref 21–101)

## 2018-07-12 ENCOUNTER — Other Ambulatory Visit: Payer: Self-pay

## 2018-07-12 ENCOUNTER — Encounter: Payer: Self-pay | Admitting: *Deleted

## 2018-07-12 VITALS — BP 133/89 | HR 89 | Temp 98.0°F | Wt 168.2 lb

## 2018-07-12 DIAGNOSIS — Z006 Encounter for examination for normal comparison and control in clinical research program: Secondary | ICD-10-CM

## 2018-07-12 NOTE — Research (Signed)
Erik Cain is here for his week 75 HPTN 083 visit. He says that he had mild tenderness x 2 days at the injection site after his last injection. Denied any swelling or bruising at site. Verbalized excellent adherence with his oral study medication. Rapid HIV non-reactive. He will return in June for his next study visit.

## 2018-07-13 LAB — AMYLASE: Amylase: 45 U/L (ref 21–101)

## 2018-07-13 LAB — LIPASE: Lipase: 68 U/L — ABNORMAL HIGH (ref 7–60)

## 2018-07-13 LAB — COMPREHENSIVE METABOLIC PANEL WITH GFR
AG Ratio: 1.8 (calc) (ref 1.0–2.5)
ALT: 26 U/L (ref 9–46)
AST: 25 U/L (ref 10–40)
Albumin: 4.8 g/dL (ref 3.6–5.1)
Alkaline phosphatase (APISO): 72 U/L (ref 36–130)
BUN: 16 mg/dL (ref 7–25)
CO2: 30 mmol/L (ref 20–32)
Calcium: 9.4 mg/dL (ref 8.6–10.3)
Chloride: 100 mmol/L (ref 98–110)
Creat: 1.25 mg/dL (ref 0.60–1.35)
Globulin: 2.7 g/dL (ref 1.9–3.7)
Glucose, Bld: 92 mg/dL (ref 65–99)
Potassium: 4.2 mmol/L (ref 3.5–5.3)
Sodium: 137 mmol/L (ref 135–146)
Total Bilirubin: 0.9 mg/dL (ref 0.2–1.2)
Total Protein: 7.5 g/dL (ref 6.1–8.1)

## 2018-07-13 LAB — HIV ANTIBODY (ROUTINE TESTING W REFLEX): HIV 1&2 Ab, 4th Generation: NONREACTIVE

## 2018-07-13 LAB — CBC WITH DIFFERENTIAL/PLATELET
Absolute Monocytes: 254 cells/uL (ref 200–950)
Basophils Absolute: 39 cells/uL (ref 0–200)
Basophils Relative: 1 %
Eosinophils Absolute: 172 cells/uL (ref 15–500)
Eosinophils Relative: 4.4 %
HCT: 49.1 % (ref 38.5–50.0)
Hemoglobin: 17 g/dL (ref 13.2–17.1)
Lymphs Abs: 1427 cells/uL (ref 850–3900)
MCH: 30.2 pg (ref 27.0–33.0)
MCHC: 34.6 g/dL (ref 32.0–36.0)
MCV: 87.2 fL (ref 80.0–100.0)
MPV: 9.8 fL (ref 7.5–12.5)
Monocytes Relative: 6.5 %
Neutro Abs: 2009 cells/uL (ref 1500–7800)
Neutrophils Relative %: 51.5 %
Platelets: 318 10*3/uL (ref 140–400)
RBC: 5.63 10*6/uL (ref 4.20–5.80)
RDW: 12.9 % (ref 11.0–15.0)
Total Lymphocyte: 36.6 %
WBC: 3.9 10*3/uL (ref 3.8–10.8)

## 2018-07-13 LAB — CK: Total CK: 162 U/L (ref 44–196)

## 2018-07-13 LAB — PHOSPHORUS: Phosphorus: 3.5 mg/dL (ref 2.5–4.5)

## 2018-09-05 ENCOUNTER — Encounter: Payer: Self-pay | Admitting: *Deleted

## 2018-09-06 ENCOUNTER — Other Ambulatory Visit: Payer: Self-pay

## 2018-09-06 ENCOUNTER — Encounter (INDEPENDENT_AMBULATORY_CARE_PROVIDER_SITE_OTHER): Payer: Self-pay

## 2018-09-06 VITALS — BP 130/81 | HR 87 | Temp 97.9°F | Wt 172.2 lb

## 2018-09-06 DIAGNOSIS — Z006 Encounter for examination for normal comparison and control in clinical research program: Secondary | ICD-10-CM

## 2018-09-06 NOTE — Research (Signed)
Participant here for [id number] study. He has no new complaints or concerns.Today's rapid HIV non-reactive. He is doing well with taking his study medication daily. He was provided with 90 day supply of oral IP and IM injection of cabotegravir/placebo. He is scheduled for return visit on 6/23.

## 2018-09-07 LAB — COMPREHENSIVE METABOLIC PANEL
AG Ratio: 1.8 (calc) (ref 1.0–2.5)
ALT: 31 U/L (ref 9–46)
AST: 25 U/L (ref 10–40)
Albumin: 4.6 g/dL (ref 3.6–5.1)
Alkaline phosphatase (APISO): 66 U/L (ref 36–130)
BUN: 16 mg/dL (ref 7–25)
CO2: 27 mmol/L (ref 20–32)
Calcium: 9.6 mg/dL (ref 8.6–10.3)
Chloride: 102 mmol/L (ref 98–110)
Creat: 1.1 mg/dL (ref 0.60–1.35)
Globulin: 2.6 g/dL (calc) (ref 1.9–3.7)
Glucose, Bld: 111 mg/dL — ABNORMAL HIGH (ref 65–99)
Potassium: 4.3 mmol/L (ref 3.5–5.3)
Sodium: 138 mmol/L (ref 135–146)
Total Bilirubin: 0.6 mg/dL (ref 0.2–1.2)
Total Protein: 7.2 g/dL (ref 6.1–8.1)

## 2018-09-07 LAB — CBC WITH DIFFERENTIAL/PLATELET
Absolute Monocytes: 324 cells/uL (ref 200–950)
Basophils Absolute: 32 cells/uL (ref 0–200)
Basophils Relative: 0.8 %
Eosinophils Absolute: 112 cells/uL (ref 15–500)
Eosinophils Relative: 2.8 %
HCT: 48 % (ref 38.5–50.0)
Hemoglobin: 16.6 g/dL (ref 13.2–17.1)
Lymphs Abs: 1464 cells/uL (ref 850–3900)
MCH: 30.2 pg (ref 27.0–33.0)
MCHC: 34.6 g/dL (ref 32.0–36.0)
MCV: 87.4 fL (ref 80.0–100.0)
MPV: 10.1 fL (ref 7.5–12.5)
Monocytes Relative: 8.1 %
Neutro Abs: 2068 cells/uL (ref 1500–7800)
Neutrophils Relative %: 51.7 %
Platelets: 288 10*3/uL (ref 140–400)
RBC: 5.49 10*6/uL (ref 4.20–5.80)
RDW: 13 % (ref 11.0–15.0)
Total Lymphocyte: 36.6 %
WBC: 4 10*3/uL (ref 3.8–10.8)

## 2018-09-07 LAB — RPR TITER: RPR Titer: 1:1 {titer} — ABNORMAL HIGH

## 2018-09-07 LAB — CK: Total CK: 110 U/L (ref 44–196)

## 2018-09-07 LAB — FLUORESCENT TREPONEMAL AB(FTA)-IGG-BLD: Fluorescent Treponemal ABS: REACTIVE — AB

## 2018-09-07 LAB — C. TRACHOMATIS/N. GONORRHOEAE RNA
C. trachomatis RNA, TMA: NOT DETECTED
N. gonorrhoeae RNA, TMA: NOT DETECTED

## 2018-09-07 LAB — AMYLASE: Amylase: 43 U/L (ref 21–101)

## 2018-09-07 LAB — LIPASE: Lipase: 58 U/L (ref 7–60)

## 2018-09-07 LAB — PHOSPHORUS: Phosphorus: 3.9 mg/dL (ref 2.5–4.5)

## 2018-09-07 LAB — RPR: RPR Ser Ql: REACTIVE — AB

## 2018-09-07 LAB — HIV ANTIBODY (ROUTINE TESTING W REFLEX): HIV 1&2 Ab, 4th Generation: NONREACTIVE

## 2018-09-08 LAB — CT/NG RNA, TMA RECTAL
Chlamydia Trachomatis RNA: NOT DETECTED
Neisseria Gonorrhoeae RNA: NOT DETECTED

## 2018-09-13 ENCOUNTER — Encounter (INDEPENDENT_AMBULATORY_CARE_PROVIDER_SITE_OTHER): Payer: Self-pay | Admitting: *Deleted

## 2018-09-13 ENCOUNTER — Other Ambulatory Visit: Payer: Self-pay

## 2018-09-13 VITALS — BP 138/92 | HR 72 | Temp 97.9°F | Wt 170.2 lb

## 2018-09-13 DIAGNOSIS — Z006 Encounter for examination for normal comparison and control in clinical research program: Secondary | ICD-10-CM

## 2018-09-13 NOTE — Research (Signed)
Erik Cain is here for his week Erik Cain visit. Denied any new problems, medications or site reaction after his last injection. BP elevated today. He states that he will have insurance starting next month and plans to schedule an appointment with PCP. We discussed monitoring his diet, limiting salt intake and exercising to help with with stress management and BP. Rapid HIV non-reactive. Verbalized excellent adherence with his medications. Next study visit scheduled for 10/24/18.

## 2018-09-14 LAB — COMPREHENSIVE METABOLIC PANEL
AG Ratio: 1.6 (calc) (ref 1.0–2.5)
ALT: 32 U/L (ref 9–46)
AST: 25 U/L (ref 10–40)
Albumin: 4.4 g/dL (ref 3.6–5.1)
Alkaline phosphatase (APISO): 64 U/L (ref 36–130)
BUN: 15 mg/dL (ref 7–25)
CO2: 27 mmol/L (ref 20–32)
Calcium: 9.1 mg/dL (ref 8.6–10.3)
Chloride: 102 mmol/L (ref 98–110)
Creat: 1.11 mg/dL (ref 0.60–1.35)
Globulin: 2.8 g/dL (calc) (ref 1.9–3.7)
Glucose, Bld: 96 mg/dL (ref 65–99)
Potassium: 4.2 mmol/L (ref 3.5–5.3)
Sodium: 138 mmol/L (ref 135–146)
Total Bilirubin: 0.6 mg/dL (ref 0.2–1.2)
Total Protein: 7.2 g/dL (ref 6.1–8.1)

## 2018-09-14 LAB — CBC WITH DIFFERENTIAL/PLATELET
Absolute Monocytes: 252 cells/uL (ref 200–950)
Basophils Absolute: 31 cells/uL (ref 0–200)
Basophils Relative: 0.9 %
Eosinophils Absolute: 122 cells/uL (ref 15–500)
Eosinophils Relative: 3.6 %
HCT: 47.9 % (ref 38.5–50.0)
Hemoglobin: 16.1 g/dL (ref 13.2–17.1)
Lymphs Abs: 1397 cells/uL (ref 850–3900)
MCH: 29.8 pg (ref 27.0–33.0)
MCHC: 33.6 g/dL (ref 32.0–36.0)
MCV: 88.7 fL (ref 80.0–100.0)
MPV: 9.9 fL (ref 7.5–12.5)
Monocytes Relative: 7.4 %
Neutro Abs: 1598 cells/uL (ref 1500–7800)
Neutrophils Relative %: 47 %
Platelets: 302 10*3/uL (ref 140–400)
RBC: 5.4 10*6/uL (ref 4.20–5.80)
RDW: 12.9 % (ref 11.0–15.0)
Total Lymphocyte: 41.1 %
WBC: 3.4 10*3/uL — ABNORMAL LOW (ref 3.8–10.8)

## 2018-09-14 LAB — LIPASE: Lipase: 73 U/L — ABNORMAL HIGH (ref 7–60)

## 2018-09-14 LAB — CK: Total CK: 96 U/L (ref 44–196)

## 2018-09-14 LAB — PHOSPHORUS: Phosphorus: 4.2 mg/dL (ref 2.5–4.5)

## 2018-09-14 LAB — HIV ANTIBODY (ROUTINE TESTING W REFLEX): HIV 1&2 Ab, 4th Generation: NONREACTIVE

## 2018-09-14 LAB — AMYLASE: Amylase: 50 U/L (ref 21–101)

## 2018-10-24 ENCOUNTER — Encounter (INDEPENDENT_AMBULATORY_CARE_PROVIDER_SITE_OTHER): Payer: Self-pay

## 2018-10-24 ENCOUNTER — Other Ambulatory Visit: Payer: Self-pay

## 2018-10-24 VITALS — BP 128/90 | HR 99 | Temp 97.6°F | Wt 171.0 lb

## 2018-10-24 DIAGNOSIS — Z006 Encounter for examination for normal comparison and control in clinical research program: Secondary | ICD-10-CM

## 2018-10-24 NOTE — Research (Signed)
Participant here for [id number] study visit. He has no new complaints or concerns. He has been doing well with IP compliance per calculation at 93.5%. Today's rapid HIV non-reactive. He received dispense of open label truvada. He is scheduled for a return visit on 9/22.

## 2018-10-25 LAB — CBC WITH DIFFERENTIAL/PLATELET
Absolute Monocytes: 272 cells/uL (ref 200–950)
Basophils Absolute: 40 cells/uL (ref 0–200)
Basophils Relative: 1 %
Eosinophils Absolute: 140 cells/uL (ref 15–500)
Eosinophils Relative: 3.5 %
HCT: 48.7 % (ref 38.5–50.0)
Hemoglobin: 16.7 g/dL (ref 13.2–17.1)
Lymphs Abs: 1512 cells/uL (ref 850–3900)
MCH: 30.4 pg (ref 27.0–33.0)
MCHC: 34.3 g/dL (ref 32.0–36.0)
MCV: 88.5 fL (ref 80.0–100.0)
MPV: 10.1 fL (ref 7.5–12.5)
Monocytes Relative: 6.8 %
Neutro Abs: 2036 cells/uL (ref 1500–7800)
Neutrophils Relative %: 50.9 %
Platelets: 306 10*3/uL (ref 140–400)
RBC: 5.5 10*6/uL (ref 4.20–5.80)
RDW: 12.4 % (ref 11.0–15.0)
Total Lymphocyte: 37.8 %
WBC: 4 10*3/uL (ref 3.8–10.8)

## 2018-10-25 LAB — COMPREHENSIVE METABOLIC PANEL
AG Ratio: 1.6 (calc) (ref 1.0–2.5)
ALT: 29 U/L (ref 9–46)
AST: 27 U/L (ref 10–40)
Albumin: 4.5 g/dL (ref 3.6–5.1)
Alkaline phosphatase (APISO): 65 U/L (ref 36–130)
BUN: 19 mg/dL (ref 7–25)
CO2: 25 mmol/L (ref 20–32)
Calcium: 10 mg/dL (ref 8.6–10.3)
Chloride: 100 mmol/L (ref 98–110)
Creat: 1.28 mg/dL (ref 0.60–1.35)
Globulin: 2.9 g/dL (calc) (ref 1.9–3.7)
Glucose, Bld: 141 mg/dL — ABNORMAL HIGH (ref 65–99)
Potassium: 4.1 mmol/L (ref 3.5–5.3)
Sodium: 137 mmol/L (ref 135–146)
Total Bilirubin: 0.9 mg/dL (ref 0.2–1.2)
Total Protein: 7.4 g/dL (ref 6.1–8.1)

## 2018-10-25 LAB — CK: Total CK: 229 U/L — ABNORMAL HIGH (ref 44–196)

## 2018-10-25 LAB — HIV ANTIBODY (ROUTINE TESTING W REFLEX): HIV 1&2 Ab, 4th Generation: NONREACTIVE

## 2018-10-25 LAB — PHOSPHORUS: Phosphorus: 3.3 mg/dL (ref 2.5–4.5)

## 2018-10-25 LAB — LIPASE: Lipase: 69 U/L — ABNORMAL HIGH (ref 7–60)

## 2018-10-25 LAB — AMYLASE: Amylase: 48 U/L (ref 21–101)

## 2018-11-14 ENCOUNTER — Other Ambulatory Visit: Payer: Self-pay | Admitting: Infectious Disease

## 2018-11-14 DIAGNOSIS — A749 Chlamydial infection, unspecified: Secondary | ICD-10-CM

## 2018-11-14 MED ORDER — DOXYCYCLINE HYCLATE 100 MG PO CAPS: 100.0000 mg | ORAL_CAPSULE | Freq: Two times a day (BID) | ORAL | 0 refills | Status: AC

## 2018-11-14 MED FILL — DOXYCYCLINE HYC 100 MG CAPS: 100 | 7 days supply | Qty: 14 | Fill #0

## 2018-12-13 ENCOUNTER — Encounter: Payer: Self-pay | Admitting: *Deleted

## 2018-12-29 ENCOUNTER — Other Ambulatory Visit: Payer: Self-pay

## 2018-12-29 VITALS — BP 124/87 | HR 106 | Temp 98.2°F

## 2018-12-29 DIAGNOSIS — Z006 Encounter for examination for normal comparison and control in clinical research program: Secondary | ICD-10-CM

## 2018-12-29 NOTE — Research (Signed)
Participant here for study [id number]. He has no new complaints or concerns. He is doing well with medication adherence. Today's rapid HIV non-reactive. He received 90 day supply of truvada. Next visit is scheduled for November.

## 2018-12-30 LAB — CBC WITH DIFFERENTIAL/PLATELET
Absolute Monocytes: 207 cells/uL (ref 200–950)
Basophils Absolute: 28 cells/uL (ref 0–200)
Basophils Relative: 0.6 %
Eosinophils Absolute: 87 cells/uL (ref 15–500)
Eosinophils Relative: 1.9 %
HCT: 49 % (ref 38.5–50.0)
Hemoglobin: 16.6 g/dL (ref 13.2–17.1)
Lymphs Abs: 1518 cells/uL (ref 850–3900)
MCH: 29.9 pg (ref 27.0–33.0)
MCHC: 33.9 g/dL (ref 32.0–36.0)
MCV: 88.1 fL (ref 80.0–100.0)
MPV: 10 fL (ref 7.5–12.5)
Monocytes Relative: 4.5 %
Neutro Abs: 2760 cells/uL (ref 1500–7800)
Neutrophils Relative %: 60 %
Platelets: 296 10*3/uL (ref 140–400)
RBC: 5.56 10*6/uL (ref 4.20–5.80)
RDW: 12.4 % (ref 11.0–15.0)
Total Lymphocyte: 33 %
WBC: 4.6 10*3/uL (ref 3.8–10.8)

## 2018-12-30 LAB — COMPREHENSIVE METABOLIC PANEL
AG Ratio: 1.7 (calc) (ref 1.0–2.5)
ALT: 30 U/L (ref 9–46)
AST: 20 U/L (ref 10–40)
Albumin: 4.4 g/dL (ref 3.6–5.1)
Alkaline phosphatase (APISO): 65 U/L (ref 36–130)
BUN: 13 mg/dL (ref 7–25)
CO2: 27 mmol/L (ref 20–32)
Calcium: 9.7 mg/dL (ref 8.6–10.3)
Chloride: 102 mmol/L (ref 98–110)
Creat: 1.15 mg/dL (ref 0.60–1.35)
Globulin: 2.6 g/dL (calc) (ref 1.9–3.7)
Glucose, Bld: 127 mg/dL — ABNORMAL HIGH (ref 65–99)
Potassium: 4.1 mmol/L (ref 3.5–5.3)
Sodium: 139 mmol/L (ref 135–146)
Total Bilirubin: 0.7 mg/dL (ref 0.2–1.2)
Total Protein: 7 g/dL (ref 6.1–8.1)

## 2018-12-30 LAB — AMYLASE: Amylase: 48 U/L (ref 21–101)

## 2018-12-30 LAB — HIV ANTIBODY (ROUTINE TESTING W REFLEX): HIV 1&2 Ab, 4th Generation: NONREACTIVE

## 2018-12-30 LAB — CK: Total CK: 88 U/L (ref 44–196)

## 2018-12-30 LAB — PHOSPHORUS: Phosphorus: 3.3 mg/dL (ref 2.5–4.5)

## 2018-12-30 LAB — LIPASE: Lipase: 50 U/L (ref 7–60)

## 2019-02-21 ENCOUNTER — Other Ambulatory Visit: Payer: Self-pay

## 2019-02-21 ENCOUNTER — Encounter (INDEPENDENT_AMBULATORY_CARE_PROVIDER_SITE_OTHER): Payer: Self-pay | Admitting: *Deleted

## 2019-02-21 VITALS — BP 125/87 | HR 74 | Temp 97.6°F | Wt 172.0 lb

## 2019-02-21 DIAGNOSIS — Z006 Encounter for examination for normal comparison and control in clinical research program: Secondary | ICD-10-CM

## 2019-02-21 NOTE — Research (Signed)
Erik Cain is here for his week [place] visit. Has been having fatigue, body aches and occasional headaches since the beginning of November. States that he has been tested for COVID, several times, and was negative. He feels that the symptoms are related to stress and the holidays. States that this was the first big holiday since his dad passed away. He says that he did miss several doses of his Truvada since his last study visit. Feels that this is also related to his current increase stress. We discussed the importance of daily dosing, which he understands. Rapid HIV non-reactive. He received 90 day supply of Truvada. He will return for his next study visit in January.

## 2019-02-22 LAB — LIPASE: Lipase: 54 U/L (ref 7–60)

## 2019-02-22 LAB — CBC WITH DIFFERENTIAL/PLATELET
Absolute Monocytes: 335 cells/uL (ref 200–950)
Basophils Absolute: 30 cells/uL (ref 0–200)
Basophils Relative: 0.7 %
Eosinophils Absolute: 129 cells/uL (ref 15–500)
Eosinophils Relative: 3 %
HCT: 47.6 % (ref 38.5–50.0)
Hemoglobin: 16.4 g/dL (ref 13.2–17.1)
Lymphs Abs: 1527 cells/uL (ref 850–3900)
MCH: 30.2 pg (ref 27.0–33.0)
MCHC: 34.5 g/dL (ref 32.0–36.0)
MCV: 87.7 fL (ref 80.0–100.0)
MPV: 10 fL (ref 7.5–12.5)
Monocytes Relative: 7.8 %
Neutro Abs: 2279 cells/uL (ref 1500–7800)
Neutrophils Relative %: 53 %
Platelets: 290 10*3/uL (ref 140–400)
RBC: 5.43 10*6/uL (ref 4.20–5.80)
RDW: 12.1 % (ref 11.0–15.0)
Total Lymphocyte: 35.5 %
WBC: 4.3 10*3/uL (ref 3.8–10.8)

## 2019-02-22 LAB — COMPREHENSIVE METABOLIC PANEL
AG Ratio: 1.5 (calc) (ref 1.0–2.5)
ALT: 36 U/L (ref 9–46)
AST: 25 U/L (ref 10–40)
Albumin: 4.4 g/dL (ref 3.6–5.1)
Alkaline phosphatase (APISO): 66 U/L (ref 36–130)
BUN: 14 mg/dL (ref 7–25)
CO2: 27 mmol/L (ref 20–32)
Calcium: 8.9 mg/dL (ref 8.6–10.3)
Chloride: 103 mmol/L (ref 98–110)
Creat: 1.03 mg/dL (ref 0.60–1.35)
Globulin: 2.9 g/dL (calc) (ref 1.9–3.7)
Glucose, Bld: 95 mg/dL (ref 65–99)
Potassium: 4.3 mmol/L (ref 3.5–5.3)
Sodium: 138 mmol/L (ref 135–146)
Total Bilirubin: 0.7 mg/dL (ref 0.2–1.2)
Total Protein: 7.3 g/dL (ref 6.1–8.1)

## 2019-02-22 LAB — URINALYSIS, ROUTINE W REFLEX MICROSCOPIC
Bilirubin Urine: NEGATIVE
Glucose, UA: NEGATIVE
Hgb urine dipstick: NEGATIVE
Ketones, ur: NEGATIVE
Leukocytes,Ua: NEGATIVE
Nitrite: NEGATIVE
Protein, ur: NEGATIVE
Specific Gravity, Urine: 1.018 (ref 1.001–1.03)
pH: 5.5 (ref 5.0–8.0)

## 2019-02-22 LAB — LIPID PANEL
Cholesterol: 178 mg/dL (ref <200)
HDL: 44 mg/dL (ref >=40)
LDL Cholesterol (Calc): 112 mg/dL (calc) — ABNORMAL HIGH
Non-HDL Cholesterol (Calc): 134 mg/dL (calc) — ABNORMAL HIGH (ref <130)
Total CHOL/HDL Ratio: 4 (calc) (ref <5.0)
Triglycerides: 116 mg/dL (ref <150)

## 2019-02-22 LAB — PHOSPHORUS: Phosphorus: 3.2 mg/dL (ref 2.5–4.5)

## 2019-02-22 LAB — AMYLASE: Amylase: 41 U/L (ref 21–101)

## 2019-02-22 LAB — FLUORESCENT TREPONEMAL AB(FTA)-IGG-BLD: Fluorescent Treponemal ABS: REACTIVE — AB

## 2019-02-22 LAB — RPR TITER: RPR Titer: 1:2 {titer} — ABNORMAL HIGH

## 2019-02-22 LAB — HEPATITIS C ANTIBODY
Hepatitis C Ab: NONREACTIVE
SIGNAL TO CUT-OFF: 0.01 (ref <1.00)

## 2019-02-22 LAB — C. TRACHOMATIS/N. GONORRHOEAE RNA
C. trachomatis RNA, TMA: NOT DETECTED
N. gonorrhoeae RNA, TMA: NOT DETECTED

## 2019-02-22 LAB — HIV ANTIBODY (ROUTINE TESTING W REFLEX): HIV 1&2 Ab, 4th Generation: NONREACTIVE

## 2019-02-22 LAB — CK: Total CK: 105 U/L (ref 44–196)

## 2019-02-22 LAB — RPR: RPR Ser Ql: REACTIVE — AB

## 2019-02-27 LAB — CT/NG RNA, TMA RECTAL
Chlamydia Trachomatis RNA: NOT DETECTED
Neisseria Gonorrhoeae RNA: NOT DETECTED

## 2019-05-02 ENCOUNTER — Encounter: Payer: Self-pay | Admitting: *Deleted

## 2019-06-02 ENCOUNTER — Ambulatory Visit: Payer: Self-pay | Attending: Internal Medicine

## 2019-06-02 DIAGNOSIS — Z23 Encounter for immunization: Secondary | ICD-10-CM

## 2019-06-02 NOTE — Progress Notes (Signed)
   Covid-19 Vaccination Clinic  Name:  Erik Cain    MRN: [id number] DOB: 1970-05-19  06/02/2019  Mr. [name] was observed post Covid-19 immunization for 15 minutes without incident. He was provided with Vaccine Information Sheet and instruction to access the V-Safe system.   Mr. [name] was instructed to call 911 with any severe reactions post vaccine: Marland Kitchen Difficulty breathing  . Swelling of face and throat  . A fast heartbeat  . A bad rash all over body  . Dizziness and weakness   Immunizations Administered    Name Date Dose VIS Date Route   Pfizer COVID-19 Vaccine 06/02/2019  3:09 PM 0.3 mL 03/03/2019 Intramuscular   Manufacturer: [employer], [employer]   Lot: [id number]   NDC: [address]-9

## 2019-06-26 ENCOUNTER — Ambulatory Visit: Payer: Self-pay | Attending: Internal Medicine

## 2019-06-26 DIAGNOSIS — Z23 Encounter for immunization: Secondary | ICD-10-CM

## 2019-06-26 NOTE — Progress Notes (Signed)
   Covid-19 Vaccination Clinic  Name:  Erik Cain    MRN: [id number] DOB: Feb 27, 1971  06/26/2019  Erik Cain was observed post Covid-19 immunization for 15 minutes without incident. He was provided with Vaccine Information Sheet and instruction to access the V-Safe system.   Erik Cain was instructed to call 911 with any severe reactions post vaccine: Marland Kitchen Difficulty breathing  . Swelling of face and throat  . A fast heartbeat  . A bad rash all over body  . Dizziness and weakness   Immunizations Administered    Name Date Dose VIS Date Route   Pfizer COVID-19 Vaccine 06/26/2019  5:01 PM 0.3 mL 03/03/2019 Intramuscular   Manufacturer: [employer], [employer]   Lot: [id number]   NDC: [address]-7

## 2020-11-28 ENCOUNTER — Other Ambulatory Visit: Payer: Self-pay

## 2020-11-28 ENCOUNTER — Ambulatory Visit: Payer: Self-pay | Admitting: Pharmacist

## 2020-12-04 ENCOUNTER — Telehealth: Payer: Self-pay

## 2020-12-04 ENCOUNTER — Encounter: Payer: Self-pay | Admitting: Pharmacist

## 2020-12-04 ENCOUNTER — Ambulatory Visit (INDEPENDENT_AMBULATORY_CARE_PROVIDER_SITE_OTHER): Payer: Self-pay | Admitting: Pharmacist

## 2020-12-04 ENCOUNTER — Other Ambulatory Visit: Payer: Self-pay

## 2020-12-04 ENCOUNTER — Other Ambulatory Visit (HOSPITAL_COMMUNITY): Payer: Self-pay

## 2020-12-04 DIAGNOSIS — Z23 Encounter for immunization: Secondary | ICD-10-CM

## 2020-12-04 NOTE — Progress Notes (Signed)
Date:  12/04/2020   HPI: Erik Cain is a 50 y.o. male who presents to the [facility] pharmacy clinic to discuss and initiate PrEP.  Insured   []    Uninsured  [x]    Patient Active Problem List   Diagnosis Date Noted   Medication management 06/23/2015   Right ankle sprain 06/23/2015    Patient's Medications  New Prescriptions   No medications on file  Previous Medications   DOXYCYCLINE (VIBRAMYCIN) 100 MG CAPSULE    Take 1 capsule (100 mg total) by mouth 2 (two) times daily.   IBUPROFEN (ADVIL,MOTRIN) 600 MG TABLET    Take 1 tablet (600 mg total) by mouth every 8 (eight) hours as needed.   STUDY - HPTN 083 - CABOTEGRAVIR 600 MG/3 ML INTRAMUSCULAR INJECTION OR PLACEBO    Inject 3 mLs (600 mg total) into the muscle every 8 (eight) weeks.   STUDY - HPTN 083 - EMTRICITABINE/TENOFOVIR DISOPROXIL FUMARATE (TRUVADA) 200-300 MG OR PLACEBO TABLET    Take 1 tablet by mouth daily.  Modified Medications   No medications on file  Discontinued Medications   No medications on file    Allergies: Allergies  Allergen Reactions   Morphine Nausea And Vomiting   Tetracycline Itching   Tetracyclines & Related Itching    Past Medical History: Past Medical History:  Diagnosis Date   Allergy    Depression    Medication management 06/23/2015   On Truvada for HIV ppx, no active infection known    Social History: Social History   Socioeconomic History   Marital status: Married    Spouse name: Not on file   Number of children: Not on file   Years of education: Not on file   Highest education level: Not on file  Occupational History   Not on file  Tobacco Use   Smoking status: Never   Smokeless tobacco: Not on file  Substance and Sexual Activity   Alcohol use: Not on file   Drug use: Not on file   Sexual activity: Not on file  Other Topics Concern   Not on file  Social History Narrative   Not on file   Social Determinants of Health   Financial Resource Strain: Not on file  Food  Insecurity: Not on file  Transportation Needs: Not on file  Physical Activity: Not on file  Stress: Not on file  Social Connections: Not on file    [facility] HIV PREP FLOWSHEET RESULTS 12/04/2020  Insurance Status Uninsured  How did you hear? Referred  Gender at birth Male  Gender identity cis-Male  Sex Partners Men only  # sex partners past 3-6 mos 1  Sex activity preferences Insertive  Condom use Yes  % condom use 50  Treated for STI? No  HIV symptoms? None  PrEP Eligibility Yes  Paper work received? No    Labs:  SCr: Lab Results  Component Value Date   CREATININE 1.03 02/21/2019   CREATININE 1.15 12/29/2018   CREATININE 1.28 10/24/2018   CREATININE 1.11 09/13/2018   CREATININE 1.10 09/06/2018   HIV Lab Results  Component Value Date   HIV NON-REACTIVE 02/21/2019   HIV NON-REACTIVE 12/29/2018   HIV NON-REACTIVE 10/24/2018   HIV NON-REACTIVE 09/13/2018   HIV NON-REACTIVE 09/06/2018   Hepatitis B Lab Results  Component Value Date   HEPBSAB NON-REACTIVE 02/08/2017   HEPBSAG NON-REACTIVE 01/25/2017   HEPBCAB NON-REACTIVE 02/08/2017   Hepatitis C Lab Results  Component Value Date   HEPCAB NON-REACTIVE 02/21/2019  Hepatitis A No results found for: HAV RPR and STI Lab Results  Component Value Date   LABRPR REACTIVE (A) 02/21/2019   LABRPR REACTIVE (A) 09/06/2018   LABRPR REACTIVE (A) 03/11/2018   LABRPR REACTIVE (A) 09/27/2017   LABRPR REACTIVE (A) 06/07/2017   RPRTITER 1:2 (H) 02/21/2019   RPRTITER 1:1 (H) 09/06/2018   RPRTITER 1:2 (H) 03/11/2018   RPRTITER 1:1 (H) 09/27/2017   RPRTITER 1:2 (H) 06/07/2017    No flowsheet data found.  Assessment: Patient presented today for second monkeypox vaccine.   Patient had previously been a participant in HPTN 083 study, but withdrew due to family matters. When asked if he would be interested in restarting PrEP, he answered in the affirmative. Patient stated he would be interested in resuming Apretude.     Patient was informed of the process of receiving PrEP therapy and the necessity of a negative HIV Ab test prior to Apretude administration. Patient denied any symptoms of acute HIV infection within the past 6 months.   Patient is currently uninsured and receives no income after quitting his job. [name] is in the process of checking patient assistance for Apretude. Patient was informed he would receive a phone call for scheduling his first Apretude injection after his patient assistance is completed.  Patient historically has multiple partners, but more recently his only partner is his husband who is also seen by pharmacy clinic for PrEP. Patient declined STI screening at today's visit (12/04/20) as he was recently screened by the health department.  Plan: Administered second Jynneos (monkeypox vaccine) 0.1 mL intradermal x1  Complete patient assistance for Apretude  Schedule first Apretude injection pending completion of patient assistance   Jani Gravel, PharmD PGY-1 Acute Care Resident  12/04/2020 2:43 PM

## 2020-12-04 NOTE — Telephone Encounter (Signed)
[facility] Patient Advocate Encounter  Completed and sent [employer] application for Apretude for this patient who is uninsured.    Patient assistance phone number for follow up is [phone].   This encounter will be updated until final determination.   Clearance Coots, CPhT Specialty Pharmacy Patient [facility] for Infectious Disease Phone: [phone] Fax:  [phone]

## 2020-12-04 NOTE — Progress Notes (Unsigned)
   12/04/2020  HPI: Erik Cain is a 50 y.o. male who presents to the [facility] pharmacy clinic for monkeypox immunization.   [name] was observed post immunization for 15 minutes without incident. He was provided with Vaccine Information Sheet and instruction to access the V-Safe system.   [name] was instructed to call 911 with any severe reactions post vaccine: Difficulty breathing  Swelling of face and throat  A fast heartbeat  A bad rash all over body  Dizziness and weakness   Jani Gravel, PharmD PGY-1 Acute Care Resident  12/04/2020 3:10 PM    12/04/2020, 3:09 PM

## 2020-12-05 ENCOUNTER — Telehealth: Payer: Self-pay

## 2020-12-05 NOTE — Telephone Encounter (Signed)
Thank You I will have medication here

## 2020-12-05 NOTE — Telephone Encounter (Signed)
[facility] Patient Advocate Encounter  Completed and sent [employer] Patient Assistance application for Apretude for this patient who is uninsured.    Patient is approved 12/05/20 through 12/05/21.  I will contact [employer] to get a delivery date.   Clearance Coots, CPhT Specialty Pharmacy Patient [facility] for Infectious Disease Phone: [phone] Fax:  [phone]

## 2020-12-05 NOTE — Telephone Encounter (Signed)
Patient scheduled for first injection with me at the end of September; thanks!

## 2020-12-13 ENCOUNTER — Telehealth: Payer: Self-pay

## 2020-12-13 NOTE — Telephone Encounter (Signed)
[facility] Patient Advocate Encounter  Patient's medication(Apretude) have been couriered to [facility] from [facility] and will be administered on patient next office visit on 12/18/20.  [employer] Patient Assistance Program  Clearance Coots , CPhT Specialty Pharmacy Patient [facility] for Infectious Disease Phone: [phone] Fax:  [phone]

## 2020-12-18 ENCOUNTER — Other Ambulatory Visit: Payer: Self-pay

## 2020-12-18 ENCOUNTER — Ambulatory Visit (INDEPENDENT_AMBULATORY_CARE_PROVIDER_SITE_OTHER): Payer: Self-pay | Admitting: Pharmacist

## 2020-12-18 DIAGNOSIS — Z79899 Other long term (current) drug therapy: Secondary | ICD-10-CM

## 2020-12-18 MED ORDER — APRETUDE 600 MG/3ML IM SUER: 600.0000 mg | INTRAMUSCULAR | 0 refills | Status: AC

## 2020-12-18 MED ORDER — CABOTEGRAVIR ER 600 MG/3ML IM SUER
600.0000 mg | Freq: Once | INTRAMUSCULAR | Status: AC
  Administered 2020-12-18: 600 mg via INTRAMUSCULAR

## 2020-12-18 NOTE — Progress Notes (Signed)
Date:  12/18/2020   HPI: Erik Cain is a 50 y.o. male who presents to the [facility] pharmacy clinic for HIV PrEP follow-up.  Insured   []    Uninsured  [x]    Patient Active Problem List   Diagnosis Date Noted   Medication management 06/23/2015   Right ankle sprain 06/23/2015    Patient's Medications  New Prescriptions   No medications on file  Previous Medications   DOXYCYCLINE (VIBRAMYCIN) 100 MG CAPSULE    Take 1 capsule (100 mg total) by mouth 2 (two) times daily.   IBUPROFEN (ADVIL,MOTRIN) 600 MG TABLET    Take 1 tablet (600 mg total) by mouth every 8 (eight) hours as needed.   STUDY - HPTN 083 - CABOTEGRAVIR 600 MG/3 ML INTRAMUSCULAR INJECTION OR PLACEBO    Inject 3 mLs (600 mg total) into the muscle every 8 (eight) weeks.   STUDY - HPTN 083 - EMTRICITABINE/TENOFOVIR DISOPROXIL FUMARATE (TRUVADA) 200-300 MG OR PLACEBO TABLET    Take 1 tablet by mouth daily.  Modified Medications   No medications on file  Discontinued Medications   No medications on file    Allergies: Allergies  Allergen Reactions   Morphine Nausea And Vomiting   Tetracycline Itching   Tetracyclines & Related Itching    Past Medical History: Past Medical History:  Diagnosis Date   Allergy    Depression    Medication management 06/23/2015   On Truvada for HIV ppx, no active infection known    Social History: Social History   Socioeconomic History   Marital status: Married    Spouse name: Not on file   Number of children: Not on file   Years of education: Not on file   Highest education level: Not on file  Occupational History   Not on file  Tobacco Use   Smoking status: Never   Smokeless tobacco: Not on file  Substance and Sexual Activity   Alcohol use: Not on file   Drug use: Not on file   Sexual activity: Not on file  Other Topics Concern   Not on file  Social History Narrative   Not on file   Social Determinants of Health   Financial Resource Strain: Not on file  Food  Insecurity: Not on file  Transportation Needs: Not on file  Physical Activity: Not on file  Stress: Not on file  Social Connections: Not on file    [facility] HIV PREP FLOWSHEET RESULTS 12/04/2020  Insurance Status Uninsured  How did you hear? Referred  Gender at birth Male  Gender identity cis-Male  Sex Partners Men only  # sex partners past 3-6 mos 1  Sex activity preferences Insertive  Condom use Yes  % condom use 50  Treated for STI? No  HIV symptoms? None  PrEP Eligibility Yes  Paper work received? No    Labs:  SCr: Lab Results  Component Value Date   CREATININE 1.03 02/21/2019   CREATININE 1.15 12/29/2018   CREATININE 1.28 10/24/2018   CREATININE 1.11 09/13/2018   CREATININE 1.10 09/06/2018   HIV Lab Results  Component Value Date   HIV NON-REACTIVE 02/21/2019   HIV NON-REACTIVE 12/29/2018   HIV NON-REACTIVE 10/24/2018   HIV NON-REACTIVE 09/13/2018   HIV NON-REACTIVE 09/06/2018   Hepatitis B Lab Results  Component Value Date   HEPBSAB NON-REACTIVE 02/08/2017   HEPBSAG NON-REACTIVE 01/25/2017   HEPBCAB NON-REACTIVE 02/08/2017   Hepatitis C Lab Results  Component Value Date   HEPCAB NON-REACTIVE 02/21/2019  Hepatitis A No results found for: HAV RPR and STI Lab Results  Component Value Date   LABRPR REACTIVE (A) 02/21/2019   LABRPR REACTIVE (A) 09/06/2018   LABRPR REACTIVE (A) 03/11/2018   LABRPR REACTIVE (A) 09/27/2017   LABRPR REACTIVE (A) 06/07/2017   RPRTITER 1:2 (H) 02/21/2019   RPRTITER 1:1 (H) 09/06/2018   RPRTITER 1:2 (H) 03/11/2018   RPRTITER 1:1 (H) 09/27/2017   RPRTITER 1:2 (H) 06/07/2017    No flowsheet data found.  Target Date: 28th of each month   Assessment: Patient presents to [facility] clinic today for first Apretude injection for PrEP.  Advised the patient about soreness in the first week or so that typically diminishes with subsequent Apretude injections. Advised patient he could use a warm compress on injection site and use  NSAIDs/APAP for pain. Patient was informed of negative rapid HIV Ab test prior to injection of Apretude. Patient was informed of the process of Apretude injections including a second injection in month, and then bimonthly thereafter. Patient was previously a participant in the HPTN 083 study, but withdrew due to personal matters, so was therefore familiar with the process.   Patient is currently uninsured, and declined the Hepatitis B vaccine for the time being when informed of the ~$170 cost of each dose. Patient also declined STI screening during this visit as he has had screening done in the past few weeks at the health department.   Plan: - Apretude IM x1  - F/U for second Apretude injection in 1 month  - F/U HIV RNA   Jani Gravel, PharmD PGY-1 Acute Care Resident  12/18/2020 10:42 AM

## 2020-12-21 LAB — HIV-1 RNA QUANT-NO REFLEX-BLD
HIV 1 RNA Quant: NOT DETECTED Copies/mL
HIV-1 RNA Quant, Log: NOT DETECTED Log cps/mL

## 2021-01-07 ENCOUNTER — Telehealth: Payer: Self-pay

## 2021-01-07 NOTE — Telephone Encounter (Signed)
[facility] Patient Advocate Encounter  Patient's medication (Apretude) have been couriered to [facility] from [facility] and will be administered on patient next office visit on 01/17/21.  Clearance Coots , CPhT Specialty Pharmacy Patient [facility] for Infectious Disease Phone: [phone] Fax:  [phone]

## 2021-01-17 ENCOUNTER — Telehealth: Payer: Self-pay

## 2021-01-17 ENCOUNTER — Ambulatory Visit: Payer: Self-pay

## 2021-01-17 ENCOUNTER — Other Ambulatory Visit: Payer: Self-pay

## 2021-01-17 NOTE — Telephone Encounter (Signed)
Called patient to see if he was going to make it to his Apretude appointment today, no answer. Left HIPAA compliant voicemail requesting callback.   Sandie Ano, RN

## 2021-01-20 NOTE — Telephone Encounter (Signed)
Thanks Megan!
